# Patient Record
Sex: Female | Born: 1980 | Hispanic: No | Marital: Single | Smoking: Never smoker
Health system: Southern US, Community
[De-identification: ages and names within clinical notes are randomized; demographics above are authoritative.]

## PROBLEM LIST (undated history)

## (undated) DIAGNOSIS — G35 Multiple sclerosis: Secondary | ICD-10-CM

## (undated) DIAGNOSIS — F32A Depression, unspecified: Secondary | ICD-10-CM

## (undated) DIAGNOSIS — F419 Anxiety disorder, unspecified: Secondary | ICD-10-CM

## (undated) DIAGNOSIS — I10 Essential (primary) hypertension: Secondary | ICD-10-CM

## (undated) DIAGNOSIS — F329 Major depressive disorder, single episode, unspecified: Secondary | ICD-10-CM

## (undated) DIAGNOSIS — D649 Anemia, unspecified: Secondary | ICD-10-CM

## (undated) DIAGNOSIS — E669 Obesity, unspecified: Secondary | ICD-10-CM

## (undated) HISTORY — PX: VAGINAL HYSTERECTOMY: SUR661

---

## 2018-08-26 ENCOUNTER — Encounter (HOSPITAL_COMMUNITY): Payer: Self-pay | Admitting: Emergency Medical Services

## 2018-08-26 ENCOUNTER — Other Ambulatory Visit: Payer: Self-pay

## 2018-08-26 ENCOUNTER — Inpatient Hospital Stay (HOSPITAL_COMMUNITY)
Admission: EM | Admit: 2018-08-26 | Discharge: 2018-08-31 | DRG: 060 | Disposition: A | Discharge status: Home / Self Care | Payer: Medicaid Other | Attending: Internal Medicine | Admitting: Internal Medicine

## 2018-08-26 DIAGNOSIS — E669 Obesity, unspecified: Secondary | ICD-10-CM | POA: Diagnosis present

## 2018-08-26 DIAGNOSIS — I10 Essential (primary) hypertension: Secondary | ICD-10-CM | POA: Diagnosis present

## 2018-08-26 DIAGNOSIS — G35 Multiple sclerosis: Secondary | ICD-10-CM | POA: Diagnosis not present

## 2018-08-26 DIAGNOSIS — H547 Unspecified visual loss: Secondary | ICD-10-CM | POA: Diagnosis present

## 2018-08-26 DIAGNOSIS — R200 Anesthesia of skin: Secondary | ICD-10-CM | POA: Diagnosis present

## 2018-08-26 DIAGNOSIS — Z6839 Body mass index (BMI) 39.0-39.9, adult: Secondary | ICD-10-CM

## 2018-08-26 HISTORY — DX: Obesity, unspecified: E66.9

## 2018-08-26 HISTORY — DX: Multiple sclerosis: G35

## 2018-08-26 HISTORY — DX: Essential (primary) hypertension: I10

## 2018-08-26 HISTORY — DX: Depression, unspecified: F32.A

## 2018-08-26 HISTORY — DX: Anemia, unspecified: D64.9

## 2018-08-26 HISTORY — DX: Major depressive disorder, single episode, unspecified: F32.9

## 2018-08-26 HISTORY — DX: Anxiety disorder, unspecified: F41.9

## 2018-08-26 LAB — CBC
HCT: 41.4 % (ref 36.0–46.0)
HEMOGLOBIN: 13.4 g/dL (ref 12.0–15.0)
MCH: 31.6 pg (ref 26.0–34.0)
MCHC: 32.4 g/dL (ref 30.0–36.0)
MCV: 97.6 fL (ref 78.0–100.0)
Platelets: 270 10*3/uL (ref 150–400)
RBC: 4.24 MIL/uL (ref 3.87–5.11)
RDW: 12.7 % (ref 11.5–15.5)
WBC: 8.1 10*3/uL (ref 4.0–10.5)

## 2018-08-26 LAB — BASIC METABOLIC PANEL
Anion gap: 10 (ref 5–15)
BUN: 9 mg/dL (ref 6–20)
CHLORIDE: 108 mmol/L (ref 98–111)
CO2: 24 mmol/L (ref 22–32)
CREATININE: 0.99 mg/dL (ref 0.44–1.00)
Calcium: 9 mg/dL (ref 8.9–10.3)
GFR calc non Af Amer: >60 mL/min (ref >60)
Glucose, Bld: 104 mg/dL — ABNORMAL HIGH (ref 70–99)
Potassium: 3.6 mmol/L (ref 3.5–5.1)
SODIUM: 142 mmol/L (ref 135–145)

## 2018-08-26 NOTE — ED Triage Notes (Signed)
Pt c/o blurred vision in right eye, numbness and tingling in right arm and right leg. Hx MS, not currently taking maintenance medications.

## 2018-08-26 NOTE — ED Provider Notes (Signed)
Patient placed in Quick Look pathway, seen and evaluated   Chief Complaint: Blurred vision and right arm numbness  HPI:   Patient with a history of MS, not on immunomodulators presents with 3 days of worsening left arm numbness as well as blurry vision in left eye.  Also reports left arm feels somewhat weak.  She reports symptoms are similar to prior flareups.  She is in between neurologists given recent change in insurance.   ROS: No headache   Physical Exam:   Gen: No distress  Neuro: Awake and Alert  Skin: Warm    Focused Exam: Strength 5/5 in bilateral upper extremities.  Sensation to light touch intact in radian, median and ulnar distribution of right hand, although she reports sensation is reduced compared to left hand.  Pupils equal round and reactive to light.  No scleral erythema.  EOMs intact without pain.   Initiation of care has begun. The patient has been counseled on the process, plan, and necessity for staying for the completion/evaluation, and the remainder of the medical screening examination No immuomodulators   Kellie Shropshire, PA-C 08/26/18 2017    Melene Plan, DO 08/26/18 2220

## 2018-08-27 ENCOUNTER — Emergency Department (HOSPITAL_COMMUNITY): Payer: Medicaid Other

## 2018-08-27 ENCOUNTER — Encounter (HOSPITAL_COMMUNITY): Payer: Self-pay | Admitting: Internal Medicine

## 2018-08-27 ENCOUNTER — Other Ambulatory Visit: Payer: Self-pay

## 2018-08-27 DIAGNOSIS — I10 Essential (primary) hypertension: Secondary | ICD-10-CM | POA: Diagnosis present

## 2018-08-27 DIAGNOSIS — H547 Unspecified visual loss: Secondary | ICD-10-CM | POA: Diagnosis not present

## 2018-08-27 DIAGNOSIS — G35 Multiple sclerosis: Secondary | ICD-10-CM | POA: Diagnosis present

## 2018-08-27 DIAGNOSIS — H538 Other visual disturbances: Secondary | ICD-10-CM | POA: Diagnosis not present

## 2018-08-27 DIAGNOSIS — R200 Anesthesia of skin: Secondary | ICD-10-CM | POA: Diagnosis not present

## 2018-08-27 DIAGNOSIS — Z6839 Body mass index (BMI) 39.0-39.9, adult: Secondary | ICD-10-CM | POA: Diagnosis not present

## 2018-08-27 DIAGNOSIS — E669 Obesity, unspecified: Secondary | ICD-10-CM | POA: Diagnosis not present

## 2018-08-27 LAB — URINALYSIS, ROUTINE W REFLEX MICROSCOPIC
Bilirubin Urine: NEGATIVE
Glucose, UA: NEGATIVE mg/dL
Hgb urine dipstick: NEGATIVE
Ketones, ur: NEGATIVE mg/dL
Leukocytes, UA: NEGATIVE
NITRITE: NEGATIVE
Protein, ur: NEGATIVE mg/dL
SPECIFIC GRAVITY, URINE: 1.026 (ref 1.005–1.030)
pH: 6 (ref 5.0–8.0)

## 2018-08-27 LAB — PREGNANCY, URINE: PREG TEST UR: NEGATIVE

## 2018-08-27 MED ORDER — ACETAMINOPHEN 325 MG PO TABS: 650.0000 mg | ORAL_TABLET | Freq: Four times a day (QID) | ORAL | Status: DC | PRN

## 2018-08-27 MED ORDER — LORAZEPAM 2 MG/ML IJ SOLN
1.0000 mg | Freq: Once | INTRAMUSCULAR | Status: AC
  Administered 2018-08-27: 1 mg via INTRAVENOUS
  Filled 2018-08-27: qty 1

## 2018-08-27 MED ORDER — SODIUM CHLORIDE 0.9 % IV SOLN: 1000.0000 mg | INTRAVENOUS | Status: DC

## 2018-08-27 MED ORDER — SODIUM CHLORIDE 0.9 % IV SOLN
1000.0000 mg | INTRAVENOUS | Status: AC
  Administered 2018-08-28 – 2018-08-31 (×4): 1000 mg via INTRAVENOUS
  Filled 2018-08-27 (×4): qty 8

## 2018-08-27 MED ORDER — LACTATED RINGERS IV SOLN
INTRAVENOUS | Status: DC
  Administered 2018-08-27 – 2018-08-30 (×7): via INTRAVENOUS

## 2018-08-27 MED ORDER — ENOXAPARIN SODIUM 40 MG/0.4ML ~~LOC~~ SOLN
40.0000 mg | SUBCUTANEOUS | Status: DC
  Administered 2018-08-27 – 2018-08-30 (×4): 40 mg via SUBCUTANEOUS
  Filled 2018-08-27 (×4): qty 0.4

## 2018-08-27 MED ORDER — SODIUM CHLORIDE 0.9 % IV SOLN
1000.0000 mg | Freq: Once | INTRAVENOUS | Status: AC
  Administered 2018-08-27: 1000 mg via INTRAVENOUS
  Filled 2018-08-27: qty 8

## 2018-08-27 MED ORDER — DOCUSATE SODIUM 100 MG PO CAPS
100.0000 mg | ORAL_CAPSULE | Freq: Two times a day (BID) | ORAL | Status: DC
  Administered 2018-08-27 – 2018-08-28 (×2): 100 mg via ORAL
  Filled 2018-08-27 (×3): qty 1

## 2018-08-27 MED ORDER — ACETAMINOPHEN 650 MG RE SUPP: 650.0000 mg | Freq: Four times a day (QID) | RECTAL | Status: DC | PRN

## 2018-08-27 MED ORDER — ONDANSETRON HCL 4 MG PO TABS: 4.0000 mg | ORAL_TABLET | Freq: Four times a day (QID) | ORAL | Status: DC | PRN

## 2018-08-27 MED ORDER — GADOBUTROL 1 MMOL/ML IV SOLN
10.0000 mL | Freq: Once | INTRAVENOUS | Status: AC | PRN
  Administered 2018-08-27: 10 mL via INTRAVENOUS

## 2018-08-27 MED ORDER — ONDANSETRON HCL 4 MG/2ML IJ SOLN: 4.0000 mg | Freq: Four times a day (QID) | INTRAMUSCULAR | Status: DC | PRN

## 2018-08-27 MED ORDER — LORAZEPAM 1 MG PO TABS: 1.0000 mg | ORAL_TABLET | Freq: Once | ORAL | Status: DC

## 2018-08-27 NOTE — ED Notes (Addendum)
Pt using phone, await neuro consult

## 2018-08-27 NOTE — Consult Note (Signed)
Neurology Consultation  Reason for Consult: Possible MS exacerbation Referring Physician: Dr. Manus Gunning  CC: Blurred vision, right-sided numbness  History is obtained from: Patient, chart  HPI: Jennifer Arias is a 37 y.o. female with a past medical history of multiple sclerosis diagnosed in 2014 for which she was on treatment with Aubagio for couple of years when she discontinued taking the medications on her own volition, who was in her usual state of health about 3 weeks ago when she started noticing some tingling in her right hand. She was out on a cruise and when she returned from a cruise 3 weeks ago, she started noticing some tingling in the right hand that then spread out to involve her whole right hemibody.  A few days ago she also started noticing some blurred vision, more so when she is looking with both eyes and reports improvement in her blood vision while looking from only one eye when the other eye is covered. In 2014, per chart review, she had some left-sided numbness for which she was evaluated with brain MRI and imaging revealed a right brachium pontis and multiple cervical spine lesions consistent with demyelination with the C-spine lesion showing active demyelination in the form of contrast-enhancement.  She had received IV steroids acutely and then started on Aubagio by a neurologist in [facility].  I have no records to review from [place]. She reports that currently she has no neurologist that she has not had symptoms for many years and she had an insurance change and could not continue to see-year-old neurologist, but because of no new symptoms, she did not seek a new neurology practice/neurologist. She reports no fevers, chills, headaches, chest pain, shortness of breath, nausea, vomiting, abdominal pain. She does report urinary urgency and hesitancy. She also reports occasional constipation, which was worse when she was on Aubagio. She also complains of  off-and-on pain in her legs, for which at some point she was prescribed Lyrica for.  She has not been taking that as well as she has not had too much of pain and discomfort. Prior to this presentation, she has not had any optic neuritis or any eye symptoms.  ROS:ROS was performed and is negative except as noted in the HPI.    Past Medical History:  Diagnosis Date  . MS (multiple sclerosis) (HCC)    No family history on file. Father has MS  Social History:   has no tobacco, alcohol, and drug history on file. Patient does not smoke. She does consume alcohol and at times binge drinks She reports using marijuana occasionally.  Medications  Current Facility-Administered Medications:  .  methylPREDNISolone sodium succinate (SOLU-MEDROL) 1,000 mg in sodium chloride 0.9 % 50 mL IVPB, 1,000 mg, Intravenous, Once, Rancour, Stephen, MD No current outpatient medications on file.  Exam: Current vital signs: BP (!) 164/113   Pulse 66   Temp 98.4 F (36.9 C)   Resp 18   SpO2 98%  Vital signs in last 24 hours: Temp:  [98.4 F (36.9 C)] 98.4 F (36.9 C) (09/24 2008) Pulse Rate:  [66-76] 66 (09/25 0030) Resp:  [17-18] 18 (09/25 0030) BP: (164-169)/(109-113) 164/113 (09/25 0030) SpO2:  [98 %-99 %] 98 % (09/25 0030) GENERAL: Awake, alert in NAD HEENT: - Normocephalic and atraumatic, dry mm, no LN++, no Thyromegally LUNGS - Clear to auscultation bilaterally with no wheezes CV - S1S2 RRR, no m/r/g, equal pulses bilaterally. ABDOMEN - Soft, nontender, nondistended with normoactive BS Ext: warm, well perfused, intact  peripheral pulses, no edema  NEURO:  Mental Status: AA&Ox3  Language: speech is clear with no dysarthria.  Naming, repetition, fluency, and comprehension intact. Cranial Nerves: PERRL, no APD. EOMI, visual fields full, visual acuity 20/25 but reports seeing things blurred, no facial asymmetry, facial sensation intact, hearing intact, tongue/uvula/soft palate midline, normal  sternocleidomastoid and trapezius muscle strength.  Tongue midline. Motor: 5/5 bilaterally with no vertical drift Tone: is normal and bulk is normal Sensation-decreased on the right hemibody to light touch and vibration Coordination: FTN intact bilaterally, no ataxia in BLE. Gait- deferred Deep tendon reflexes: 3+ biceps bilaterally, 3+ brachial radialis bilaterally, 3+ knee jerks bilaterally, 2+ ankle jerks bilaterally, upgoing toes, positive Hoffman's bilaterally.  Labs I have reviewed labs in epic and the results pertinent to this consultation are: CBC    Component Value Date/Time   WBC 8.1 08/26/2018 2017   RBC 4.24 08/26/2018 2017   HGB 13.4 08/26/2018 2017   HCT 41.4 08/26/2018 2017   PLT 270 08/26/2018 2017   MCV 97.6 08/26/2018 2017   MCH 31.6 08/26/2018 2017   MCHC 32.4 08/26/2018 2017   RDW 12.7 08/26/2018 2017   CMP     Component Value Date/Time   NA 142 08/26/2018 2017   K 3.6 08/26/2018 2017   CL 108 08/26/2018 2017   CO2 24 08/26/2018 2017   GLUCOSE 104 (H) 08/26/2018 2017   BUN 9 08/26/2018 2017   CREATININE 0.99 08/26/2018 2017   CALCIUM 9.0 08/26/2018 2017   GFRNONAA >60 08/26/2018 2017   GFRAA >60 08/26/2018 2017  Urinalysis unremarkable for UTI  Imaging I have reviewed the images obtained: MRI examination of the brain, orbits and cervical spine with and without contrast Subcentimeter focus of diffusion abnormality with associated contrast enhancement seen in the left paramedian pontomedullary junction, likely consistent with active demyelination. Normal MRI of the orbits. Cervical spine MRI shows C2-3 and C3-4 level T2 hyperintensities without any enhancement. She has old MRI in PACS, not accessible through the EMR but only directly through PACS that showed an MRI brain and C-spine from 2014 with no brain lesions but enhancing lesion in the upper cervical cord.  The current T2 hyperintensity is seen on the C-spine MRI is likely sequelae of that  lesion.  Assessment:  Jennifer Arias is a 37 year old African-American woman with a past medical history of multiple sclerosis that was diagnosed in 2014, for which she was on treatment with Aubagio for a few years when she stopped taking the medications on her own volition, who now presents with right-sided tingling and numbness and blurred vision along with an MRI of the brain that shows active enhancement in a small lesion seen in the left pontomedullary junction as well as sequela of demyelinating disease in her cervical spinal cord. I have reiterated the importance of getting back on a disease modifying agent for the longer term to prevent accruing disability with relapses. For now, we will treat with high-dose steroids for the acute exacerbation.  Urinalysis and blood work reassuring for no evidence of infection.  Impression: Relapsing remitting multiple sclerosis- acute exacerbation  Recommendations: --Start IV Solu-Medrol 1 g daily for 5 days-first dose now in the emergency room.  --Patient would like to explore the possibility of possibly getting some infusions at home and not necessarily stay in the hospital 5 days if possible.  I would defer this to the primary team and discharge planning if possible.  --She will need follow-up with outpatient neurology-Dr. Despina Arias at [facility] in 4 to 6 weeks after discharge.  Neurology will continue to follow with you.  Please call with questions.  -- Milon Dikes, MD [facility] Pager: [phone] If 7pm to 7am, please call on call as listed on AMION.

## 2018-08-27 NOTE — ED Notes (Signed)
ED Provider at bedside. 

## 2018-08-27 NOTE — H&P (Signed)
History and Physical    [name]:[id number] DOB: 02/28/1981 DOA: 08/26/2018  PCP: Patient, No Pcp Per - last saw a doctor in March [facility] [place] in [place]) Consultants:  Renata Caprice - neurology Patient coming from:  Here temporarily in this area - staying with a friend; NOK: Mother, [phone], Louretta Shorten  Chief Complaint:  Diplopia  HPI: Jennifer Arias is a 37 y.o. female with medical history significant of MS presenting with possible flare.  She has had flares in the past with tingling/numbness in limbs.  This started again intermittently a few days ago.  She went out drinking a few days ago and started swerving while driving home, felt very impaired despite minimal ETOH.  The next day she slept all day, felt like she had a hangover despite not drinking much.  It happened again yesterday - she again slept all day.  When she tried to drive, she still felt drunk.  She realized she had diplopia.  Driving, she "felt like the lines were moving."  She was driving with one eye closed and so she decided to stop at an urgent care.  They sent her to the ER.  She does have an occasional sharp shooting pain through her eyeball; her neurologist previously didn't think that was related to her MS.  She previously took medication in the past, but it became so overwhelming that she stopped taking it (needs to resume Aubagio and Lyrica).  Overall, her MS has been pretty well controlled.   ED Course: Carryover, per Dr. Corey Harold:  37 year old female with previous history of multiple sclerosis in remission presents with 3 days history of diplopia. MRI of the brain orbits and C-spine was done. Shows acute demyelinating lesion. There is also possibility of stroke. Neurology on case. Was started on Solu-Medrol 1 dose.   Review of Systems: As per HPI; otherwise review of systems reviewed and negative.   Ambulatory Status:  Ambulates without assistance  Past Medical History:  Diagnosis Date  .  Essential hypertension   . MS (multiple sclerosis) (HCC)   . Obesity     Past Surgical History:  Procedure Laterality Date  . PARTIAL HYSTERECTOMY      Social History   Socioeconomic History  . Marital status: Single    Spouse name: Not on file  . Number of children: Not on file  . Years of education: Not on file  . Highest education level: Not on file  Occupational History  . Occupation: various part-time jobs  Engineer, production  . Financial resource strain: Not on file  . Food insecurity:    Worry: Not on file    Inability: Not on file  . Transportation needs:    Medical: Not on file    Non-medical: Not on file  Tobacco Use  . Smoking status: Never Smoker  . Smokeless tobacco: Never Used  Substance and Sexual Activity  . Alcohol use: Yes    Comment: occasional  . Drug use: Yes    Types: Marijuana    Comment: daily use  . Sexual activity: Not on file  Lifestyle  . Physical activity:    Days per week: Not on file    Minutes per session: Not on file  . Stress: Not on file  Relationships  . Social connections:    Talks on phone: Not on file    Gets together: Not on file    Attends religious service: Not on file    Active member of club or organization: Not  on file    Attends meetings of clubs or organizations: Not on file    Relationship status: Not on file  . Intimate partner violence:    Fear of current or ex partner: Not on file    Emotionally abused: Not on file    Physically abused: Not on file    Forced sexual activity: Not on file  Other Topics Concern  . Not on file  Social History Narrative  . Not on file    Allergies  Allergen Reactions  . Sulfa Antibiotics Hives, Itching and Rash    Family History  Problem Relation Age of Onset  . Multiple sclerosis Father     Prior to Admission medications   Not on File    Physical Exam: Vitals:   08/26/18 2008 08/27/18 0030  BP: (!) 169/109 (!) 164/113  Pulse: 76 66  Resp: 17 18  Temp: 98.4 F  (36.9 C)   SpO2: 99% 98%     General:  Appears calm and comfortable and is NAD Eyes:  PERRL, EOMI, normal lids, iris ENT:  grossly normal hearing, lips & tongue, mmm; appropriate dentition Neck:  no LAD, masses or thyromegaly; no carotid bruits Cardiovascular:  RRR, no m/r/g. No LE edema.  Respiratory:   CTA bilaterally with no wheezes/rales/rhonchi.  Normal respiratory effort. Abdomen:  soft, NT, ND, NABS Back:   normal alignment, no CVAT Skin:  no rash or induration seen on limited exam Musculoskeletal:  grossly normal tone BUE/BLE, good ROM, no bony abnormality Psychiatric:  grossly normal mood and affect, speech fluent and appropriate, AOx3 Neurologic:  CN 2-12 grossly intact, moves all extremities in coordinated fashion, sensation intact    Radiological Exams on Admission: Mr [name] And Wo Contrast  Result Date: 08/27/2018 CLINICAL DATA:  Initial evaluation for worsening left arm numbness with blurry vision in left eye. History of multiple sclerosis. EXAM: MRI HEAD AND ORBITS WITHOUT AND WITH CONTRAST MRI CERVICAL SPINE WITHOUT AND WITH CONTRAST TECHNIQUE: Multiplanar, multiecho pulse sequences of the brain and surrounding structures were obtained without and with intravenous contrast. Multiplanar, multiecho pulse sequences of the orbits and surrounding structures were obtained including fat saturation techniques, before and after intravenous contrast administration. CONTRAST:  10 CC OF GADAVIST. COMPARISON:  None available. FINDINGS: MRI HEAD FINDINGS Brain: Single 6 mm focus of diffusion abnormality seen at the left paramedian aspect of the pontomedullary junction (series 5, image 43). Associated somewhat vague T2/FLAIR signal abnormality (series 11, image 8). Lesion demonstrates postcontrast enhancement (series 46, image 12). Finding is nonspecific, and could reflect a small focus of acute demyelination or possibly subacute infarct. Appearance of the brain is otherwise normal.  Cerebral volume within normal limits. Few additional punctate T2/FLAIR hyperintensities noted within the supratentorial cerebral white matter, nonspecific. No other evidence for acute or subacute infarct. No encephalomalacia to suggest chronic cortical infarction. No foci of susceptibility artifact to suggest acute or chronic intracranial hemorrhage. No mass lesion, midline shift or mass effect. No hydrocephalus. No extra-axial fluid collection. Incidental note made of an empty sella. No other abnormal enhancement. Vascular: Major intracranial vascular flow voids maintained. Skull and upper cervical spine: Craniocervical junction normal. No focal marrow replacing lesion. Scalp soft tissues unremarkable. Other: No mastoid effusion.  Inner ear structures normal. MRI ORBITS FINDINGS Orbits: Globes are symmetric in size with normal appearance and morphology bilaterally. Optic nerves symmetric. No optic nerve edema or enhancement to suggest acute optic neuritis. Extra-ocular muscles within normal limits bilaterally. Intraconal and extraconal fat well-maintained.  Lacrimal glands normal. Superior orbital veins symmetric and normal. No abnormality at the orbital apices. Visualized sinuses: Visualized paranasal sinuses are clear. Soft tissues: Visualized periorbital soft tissues unremarkable. MRI CERVICAL SPINE FINDINGS Alignment: Mild levoscoliosis with straightening of the normal cervical lordosis. No listhesis. Vertebra: Vertebral body heights maintained without evidence for acute or chronic fracture. Bone marrow signal intensity within normal limits. No worrisome osseous lesions. No abnormal marrow edema. Cord: Subtle patchy T2 signal abnormality within the right aspect of the cord at the level of C2-3 (series 32, image 10). Additional subtle possible cord signal abnormality within the right hemi cord at C3-4 (series 32, image 17). No other cord signal abnormality. No abnormal cord expansion or enhancement to suggest  active demyelination. Paraspinous soft tissues: Craniocervical junction normal. Paraspinous and prevertebral soft tissues within normal limits. Normal intravascular flow voids seen within the vertebral arteries bilaterally. Disc levels: C2-C3: Unremarkable. C3-C4: Right eccentric disc osteophyte complex with right greater than left uncovertebral hypertrophy. Flattening and indentation of the right ventral thecal sac with mild flattening of the right hemi cord. Mild spinal stenosis. Mild to moderate right C4 foraminal stenosis. C4-C5: Mild diffuse disc bulge with bilateral uncovertebral hypertrophy. No spinal stenosis. Mild right C5 foraminal narrowing. C5-C6: Diffuse disc bulge with right greater than left uncovertebral hypertrophy. Flattening and partial effacement of the ventral thecal sac with resultant moderate spinal stenosis. Mild flattening of the ventral cord. Severe right with moderate left C6 foraminal stenosis. C6-C7: Diffuse disc bulge, slightly asymmetric to the left. Bilateral uncovertebral hypertrophy. Flattening of the ventral CSF and resultant mild spinal stenosis without cord deformity. Foramina remain patent. C7-T1:  Unremarkable. Visualized upper thoracic spine demonstrates no significant finding. IMPRESSION: MRI HEAD IMPRESSION: 1. 6 mm focus of diffusion abnormality with associated enhancement involving the left paramedian pontomedullary junction. Finding is nonspecific, and could reflect a small focus of acute demyelination. A focal subacute ischemic infarct would be the primary differential consideration. Either way, finding could produce patient's symptoms. 2. No other acute intracranial abnormality. 3. Empty sella. MRI ORBITS IMPRESSION: Normal MRI of the orbits. No evidence for acute optic neuritis or other abnormality. MRI CERVICAL IMPRESSION: 1. Question 2 small foci of cord signal abnormality within the right hemi cord at the level of C2-3 and C3-4 as above, suggestive for possible  underlying demyelinating disease. No evidence for active demyelination. 2. Degenerative disc osteophyte at C5-6 with resultant moderate canal with severe right and moderate left C6 foraminal narrowing. 3. Right eccentric disc osteophyte at C3-4 with resultant mild canal with mild to moderate right C4 foraminal stenosis. 4. Mild right C5 foraminal stenosis related uncovertebral hypertrophy. Electronically Signed   By: Rise Mu M.D.   On: 08/27/2018 04:44   Mr Cervical Spine W Or Wo Contrast  Result Date: 08/27/2018 CLINICAL DATA:  Initial evaluation for worsening left arm numbness with blurry vision in left eye. History of multiple sclerosis. EXAM: MRI HEAD AND ORBITS WITHOUT AND WITH CONTRAST MRI CERVICAL SPINE WITHOUT AND WITH CONTRAST TECHNIQUE: Multiplanar, multiecho pulse sequences of the brain and surrounding structures were obtained without and with intravenous contrast. Multiplanar, multiecho pulse sequences of the orbits and surrounding structures were obtained including fat saturation techniques, before and after intravenous contrast administration. CONTRAST:  10 CC OF GADAVIST. COMPARISON:  None available. FINDINGS: MRI HEAD FINDINGS Brain: Single 6 mm focus of diffusion abnormality seen at the left paramedian aspect of the pontomedullary junction (series 5, image 43). Associated somewhat vague T2/FLAIR signal abnormality (series 11, image 8).  Lesion demonstrates postcontrast enhancement (series 46, image 12). Finding is nonspecific, and could reflect a small focus of acute demyelination or possibly subacute infarct. Appearance of the brain is otherwise normal. Cerebral volume within normal limits. Few additional punctate T2/FLAIR hyperintensities noted within the supratentorial cerebral white matter, nonspecific. No other evidence for acute or subacute infarct. No encephalomalacia to suggest chronic cortical infarction. No foci of susceptibility artifact to suggest acute or chronic  intracranial hemorrhage. No mass lesion, midline shift or mass effect. No hydrocephalus. No extra-axial fluid collection. Incidental note made of an empty sella. No other abnormal enhancement. Vascular: Major intracranial vascular flow voids maintained. Skull and upper cervical spine: Craniocervical junction normal. No focal marrow replacing lesion. Scalp soft tissues unremarkable. Other: No mastoid effusion.  Inner ear structures normal. MRI ORBITS FINDINGS Orbits: Globes are symmetric in size with normal appearance and morphology bilaterally. Optic nerves symmetric. No optic nerve edema or enhancement to suggest acute optic neuritis. Extra-ocular muscles within normal limits bilaterally. Intraconal and extraconal fat well-maintained. Lacrimal glands normal. Superior orbital veins symmetric and normal. No abnormality at the orbital apices. Visualized sinuses: Visualized paranasal sinuses are clear. Soft tissues: Visualized periorbital soft tissues unremarkable. MRI CERVICAL SPINE FINDINGS Alignment: Mild levoscoliosis with straightening of the normal cervical lordosis. No listhesis. Vertebra: Vertebral body heights maintained without evidence for acute or chronic fracture. Bone marrow signal intensity within normal limits. No worrisome osseous lesions. No abnormal marrow edema. Cord: Subtle patchy T2 signal abnormality within the right aspect of the cord at the level of C2-3 (series 32, image 10). Additional subtle possible cord signal abnormality within the right hemi cord at C3-4 (series 32, image 17). No other cord signal abnormality. No abnormal cord expansion or enhancement to suggest active demyelination. Paraspinous soft tissues: Craniocervical junction normal. Paraspinous and prevertebral soft tissues within normal limits. Normal intravascular flow voids seen within the vertebral arteries bilaterally. Disc levels: C2-C3: Unremarkable. C3-C4: Right eccentric disc osteophyte complex with right greater than  left uncovertebral hypertrophy. Flattening and indentation of the right ventral thecal sac with mild flattening of the right hemi cord. Mild spinal stenosis. Mild to moderate right C4 foraminal stenosis. C4-C5: Mild diffuse disc bulge with bilateral uncovertebral hypertrophy. No spinal stenosis. Mild right C5 foraminal narrowing. C5-C6: Diffuse disc bulge with right greater than left uncovertebral hypertrophy. Flattening and partial effacement of the ventral thecal sac with resultant moderate spinal stenosis. Mild flattening of the ventral cord. Severe right with moderate left C6 foraminal stenosis. C6-C7: Diffuse disc bulge, slightly asymmetric to the left. Bilateral uncovertebral hypertrophy. Flattening of the ventral CSF and resultant mild spinal stenosis without cord deformity. Foramina remain patent. C7-T1:  Unremarkable. Visualized upper thoracic spine demonstrates no significant finding. IMPRESSION: MRI HEAD IMPRESSION: 1. 6 mm focus of diffusion abnormality with associated enhancement involving the left paramedian pontomedullary junction. Finding is nonspecific, and could reflect a small focus of acute demyelination. A focal subacute ischemic infarct would be the primary differential consideration. Either way, finding could produce patient's symptoms. 2. No other acute intracranial abnormality. 3. Empty sella. MRI ORBITS IMPRESSION: Normal MRI of the orbits. No evidence for acute optic neuritis or other abnormality. MRI CERVICAL IMPRESSION: 1. Question 2 small foci of cord signal abnormality within the right hemi cord at the level of C2-3 and C3-4 as above, suggestive for possible underlying demyelinating disease. No evidence for active demyelination. 2. Degenerative disc osteophyte at C5-6 with resultant moderate canal with severe right and moderate left C6 foraminal narrowing. 3. Right eccentric  disc osteophyte at C3-4 with resultant mild canal with mild to moderate right C4 foraminal stenosis. 4. Mild  right C5 foraminal stenosis related uncovertebral hypertrophy. Electronically Signed   By: Rise Mu M.D.   On: 08/27/2018 04:44   Mr Rockwell [place] ZO Contrast  Result Date: 08/27/2018 CLINICAL DATA:  Initial evaluation for worsening left arm numbness with blurry vision in left eye. History of multiple sclerosis. EXAM: MRI HEAD AND ORBITS WITHOUT AND WITH CONTRAST MRI CERVICAL SPINE WITHOUT AND WITH CONTRAST TECHNIQUE: Multiplanar, multiecho pulse sequences of the brain and surrounding structures were obtained without and with intravenous contrast. Multiplanar, multiecho pulse sequences of the orbits and surrounding structures were obtained including fat saturation techniques, before and after intravenous contrast administration. CONTRAST:  10 CC OF GADAVIST. COMPARISON:  None available. FINDINGS: MRI HEAD FINDINGS Brain: Single 6 mm focus of diffusion abnormality seen at the left paramedian aspect of the pontomedullary junction (series 5, image 43). Associated somewhat vague T2/FLAIR signal abnormality (series 11, image 8). Lesion demonstrates postcontrast enhancement (series 46, image 12). Finding is nonspecific, and could reflect a small focus of acute demyelination or possibly subacute infarct. Appearance of the brain is otherwise normal. Cerebral volume within normal limits. Few additional punctate T2/FLAIR hyperintensities noted within the supratentorial cerebral white matter, nonspecific. No other evidence for acute or subacute infarct. No encephalomalacia to suggest chronic cortical infarction. No foci of susceptibility artifact to suggest acute or chronic intracranial hemorrhage. No mass lesion, midline shift or mass effect. No hydrocephalus. No extra-axial fluid collection. Incidental note made of an empty sella. No other abnormal enhancement. Vascular: Major intracranial vascular flow voids maintained. Skull and upper cervical spine: Craniocervical junction normal. No focal marrow replacing  lesion. Scalp soft tissues unremarkable. Other: No mastoid effusion.  Inner ear structures normal. MRI ORBITS FINDINGS Orbits: Globes are symmetric in size with normal appearance and morphology bilaterally. Optic nerves symmetric. No optic nerve edema or enhancement to suggest acute optic neuritis. Extra-ocular muscles within normal limits bilaterally. Intraconal and extraconal fat well-maintained. Lacrimal glands normal. Superior orbital veins symmetric and normal. No abnormality at the orbital apices. Visualized sinuses: Visualized paranasal sinuses are clear. Soft tissues: Visualized periorbital soft tissues unremarkable. MRI CERVICAL SPINE FINDINGS Alignment: Mild levoscoliosis with straightening of the normal cervical lordosis. No listhesis. Vertebra: Vertebral body heights maintained without evidence for acute or chronic fracture. Bone marrow signal intensity within normal limits. No worrisome osseous lesions. No abnormal marrow edema. Cord: Subtle patchy T2 signal abnormality within the right aspect of the cord at the level of C2-3 (series 32, image 10). Additional subtle possible cord signal abnormality within the right hemi cord at C3-4 (series 32, image 17). No other cord signal abnormality. No abnormal cord expansion or enhancement to suggest active demyelination. Paraspinous soft tissues: Craniocervical junction normal. Paraspinous and prevertebral soft tissues within normal limits. Normal intravascular flow voids seen within the vertebral arteries bilaterally. Disc levels: C2-C3: Unremarkable. C3-C4: Right eccentric disc osteophyte complex with right greater than left uncovertebral hypertrophy. Flattening and indentation of the right ventral thecal sac with mild flattening of the right hemi cord. Mild spinal stenosis. Mild to moderate right C4 foraminal stenosis. C4-C5: Mild diffuse disc bulge with bilateral uncovertebral hypertrophy. No spinal stenosis. Mild right C5 foraminal narrowing. C5-C6: Diffuse  disc bulge with right greater than left uncovertebral hypertrophy. Flattening and partial effacement of the ventral thecal sac with resultant moderate spinal stenosis. Mild flattening of the ventral cord. Severe right with moderate left C6 foraminal stenosis. C6-C7:  Diffuse disc bulge, slightly asymmetric to the left. Bilateral uncovertebral hypertrophy. Flattening of the ventral CSF and resultant mild spinal stenosis without cord deformity. Foramina remain patent. C7-T1:  Unremarkable. Visualized upper thoracic spine demonstrates no significant finding. IMPRESSION: MRI HEAD IMPRESSION: 1. 6 mm focus of diffusion abnormality with associated enhancement involving the left paramedian pontomedullary junction. Finding is nonspecific, and could reflect a small focus of acute demyelination. A focal subacute ischemic infarct would be the primary differential consideration. Either way, finding could produce patient's symptoms. 2. No other acute intracranial abnormality. 3. Empty sella. MRI ORBITS IMPRESSION: Normal MRI of the orbits. No evidence for acute optic neuritis or other abnormality. MRI CERVICAL IMPRESSION: 1. Question 2 small foci of cord signal abnormality within the right hemi cord at the level of C2-3 and C3-4 as above, suggestive for possible underlying demyelinating disease. No evidence for active demyelination. 2. Degenerative disc osteophyte at C5-6 with resultant moderate canal with severe right and moderate left C6 foraminal narrowing. 3. Right eccentric disc osteophyte at C3-4 with resultant mild canal with mild to moderate right C4 foraminal stenosis. 4. Mild right C5 foraminal stenosis related uncovertebral hypertrophy. Electronically Signed   By: Rise Mu M.D.   On: 08/27/2018 04:44    EKG: Not done   Labs on Admission: I have personally reviewed the available labs and imaging studies at the time of the admission.  Pertinent labs:   Unremarkable BMP Normal CBC Upreg  negative UA uremarkable   Assessment/Plan Principal Problem:   Multiple sclerosis exacerbation (HCC) Active Problems:   Essential hypertension   MS exacerbation -Patient with prior h/o MS, not on medications, presenting with diplopia >48 hours old with demyelination and an area of active enhancement of a small lesion seen on MRILT -  -Neurology was consulted and agree that this is likely an acute exacerbation of relapsing/remitting MS -Physical/occupational therapy consults.  -Continue IV Solu-Medrol 1000 mg daily x 5 days -She really needs to resume outpatient neurology f/u and resume disease-modifying medications  HTN -She reports prior diagnosis of HTN -With prior weight loss, her BP was appropriately controlled without medications -She appears to have recurrent HTN and appears likely to need medications for this issue, as well -Will give prn hydralazine IV for now and follow    DVT prophylaxis:  Lovenox  Code Status:  Full - confirmed with patient Family Communication: None present Disposition Plan:  Home once clinically improved Consults called: Neurology; PT/OT  Admission status: Admit - It is my clinical opinion that admission to INPATIENT is reasonable and necessary because of the expectation that this patient will require hospital care that crosses at least 2 midnights to treat this condition based on the medical complexity of the problems presented.  Given the aforementioned information, the predictability of an adverse outcome is felt to be significant.     Jonah Blue MD [facility]  If note is complete, please contact covering daytime or nighttime physician. [email or web address] Password [facility]  08/27/2018, 9:45 AM

## 2018-08-27 NOTE — ED Notes (Signed)
Pt transported to MRI 

## 2018-08-27 NOTE — ED Notes (Signed)
Pt bk from MRI 

## 2018-08-27 NOTE — Progress Notes (Signed)
Patient admitted to floor 3w-15, orders reviewed

## 2018-08-27 NOTE — ED Notes (Signed)
Pt ambulatory to restroom with no assistance.

## 2018-08-27 NOTE — ED Notes (Signed)
Pt. Stated that using both eyes to see, vision is doubled and blurry, but when covering one eye individually, can see normal (not blurry, not double vision).  Pt. Also stated having some drinks the other day and has a history of MS.   Pt was able to read to the same level on visual acuity.  Both eyes 20/25 Lt. Eye 20/25 Rt eye 20/25

## 2018-08-27 NOTE — ED Provider Notes (Signed)
[facility] EMERGENCY DEPARTMENT Provider Note   CSN: [id number] Arrival date & time: 08/26/18  1945     History   Chief Complaint Chief Complaint  Patient presents with  . Tingling    HPI Jennifer Arias is a 37 y.o. female.  Patient with history of multiple sclerosis presenting with a 2-day history of visual changes and right-sided numbness.  Last seen normal 2 days ago.  States symptoms started after she drank alcohol heavily over the weekend.  She initially thought that she was hung over.  She notices blurry vision and double vision when she uses both eyes but vision becomes normal when when eyes closed.  Has tingling and numbness to her left arm and left leg as well.  No focal weakness.  No difficulty speaking or difficulty swallowing.  Patient states history of MS but has not been on medications for several years and does not have a neurologist.  Denies any headache, fever, chest pain, shortness of breath.  No bowel or bladder incontinence.  No difficulty speaking or difficulty swallowing.  The history is provided by the patient.    Past Medical History:  Diagnosis Date  . MS (multiple sclerosis) (HCC)     There are no active problems to display for this patient.   History reviewed. No pertinent surgical history.   OB History   None      Home Medications    Prior to Admission medications   Not on File    Family History No family history on file.  Social History Social History   Tobacco Use  . Smoking status: Not on file  Substance Use Topics  . Alcohol use: Not on file  . Drug use: Not on file     Allergies   Sulfa antibiotics   Review of Systems Review of Systems  Constitutional: Negative for activity change, appetite change and fever.  HENT: Negative for congestion and rhinorrhea.   Eyes: Positive for visual disturbance. Negative for photophobia.  Respiratory: Negative for chest tightness and shortness of breath.     Cardiovascular: Negative for chest pain.  Gastrointestinal: Negative for abdominal pain, nausea and vomiting.  Endocrine: Negative for polyuria.  Genitourinary: Negative for dysuria, hematuria, vaginal bleeding and vaginal discharge.  Musculoskeletal: Negative for arthralgias and myalgias.  Neurological: Positive for numbness. Negative for dizziness, weakness and headaches.   all other systems are negative except as noted in the HPI and PMH.     Physical Exam Updated Vital Signs BP (!) 164/113   Pulse 66   Temp 98.4 F (36.9 C)   Resp 18   SpO2 98%   Physical Exam  Constitutional: She is oriented to person, place, and time. She appears well-developed and well-nourished. No distress.  HENT:  Head: Normocephalic and atraumatic.  Mouth/Throat: Oropharynx is clear and moist. No oropharyngeal exudate.  Eyes: Pupils are equal, round, and reactive to light. Conjunctivae and EOM are normal.  Double vision resolves when one eye is closed.  Neck: Normal range of motion. Neck supple.  No meningismus.  Cardiovascular: Normal rate, regular rhythm, normal heart sounds and intact distal pulses.  No murmur heard. Pulmonary/Chest: Effort normal and breath sounds normal. No respiratory distress.  Abdominal: Soft. There is no tenderness. There is no rebound and no guarding.  Musculoskeletal: Normal range of motion. She exhibits no edema or tenderness.  Neurological: She is alert and oriented to person, place, and time. No cranial nerve deficit. She exhibits normal muscle tone. Coordination  normal.   5/5 strength throughout. CN 2-12 intact.Equal grip strength.   Decreased sensation subjectively to right arm and right leg  Skin: Skin is warm.  Psychiatric: She has a normal mood and affect. Her behavior is normal.  Nursing note and vitals reviewed.    ED Treatments / Results  Labs (all labs ordered are listed, but only abnormal results are displayed) Labs Reviewed  BASIC METABOLIC PANEL -  Abnormal; Notable for the following components:      Result Value   Glucose, Bld 104 (*)    All other components within normal limits  URINALYSIS, ROUTINE W REFLEX MICROSCOPIC - Abnormal; Notable for the following components:   APPearance HAZY (*)    All other components within normal limits  CBC  PREGNANCY, URINE    EKG None  Radiology Mr [name] And Wo Contrast  Result Date: 08/27/2018 CLINICAL DATA:  Initial evaluation for worsening left arm numbness with blurry vision in left eye. History of multiple sclerosis. EXAM: MRI HEAD AND ORBITS WITHOUT AND WITH CONTRAST MRI CERVICAL SPINE WITHOUT AND WITH CONTRAST TECHNIQUE: Multiplanar, multiecho pulse sequences of the brain and surrounding structures were obtained without and with intravenous contrast. Multiplanar, multiecho pulse sequences of the orbits and surrounding structures were obtained including fat saturation techniques, before and after intravenous contrast administration. CONTRAST:  10 CC OF GADAVIST. COMPARISON:  None available. FINDINGS: MRI HEAD FINDINGS Brain: Single 6 mm focus of diffusion abnormality seen at the left paramedian aspect of the pontomedullary junction (series 5, image 43). Associated somewhat vague T2/FLAIR signal abnormality (series 11, image 8). Lesion demonstrates postcontrast enhancement (series 46, image 12). Finding is nonspecific, and could reflect a small focus of acute demyelination or possibly subacute infarct. Appearance of the brain is otherwise normal. Cerebral volume within normal limits. Few additional punctate T2/FLAIR hyperintensities noted within the supratentorial cerebral white matter, nonspecific. No other evidence for acute or subacute infarct. No encephalomalacia to suggest chronic cortical infarction. No foci of susceptibility artifact to suggest acute or chronic intracranial hemorrhage. No mass lesion, midline shift or mass effect. No hydrocephalus. No extra-axial fluid collection. Incidental  note made of an empty sella. No other abnormal enhancement. Vascular: Major intracranial vascular flow voids maintained. Skull and upper cervical spine: Craniocervical junction normal. No focal marrow replacing lesion. Scalp soft tissues unremarkable. Other: No mastoid effusion.  Inner ear structures normal. MRI ORBITS FINDINGS Orbits: Globes are symmetric in size with normal appearance and morphology bilaterally. Optic nerves symmetric. No optic nerve edema or enhancement to suggest acute optic neuritis. Extra-ocular muscles within normal limits bilaterally. Intraconal and extraconal fat well-maintained. Lacrimal glands normal. Superior orbital veins symmetric and normal. No abnormality at the orbital apices. Visualized sinuses: Visualized paranasal sinuses are clear. Soft tissues: Visualized periorbital soft tissues unremarkable. MRI CERVICAL SPINE FINDINGS Alignment: Mild levoscoliosis with straightening of the normal cervical lordosis. No listhesis. Vertebra: Vertebral body heights maintained without evidence for acute or chronic fracture. Bone marrow signal intensity within normal limits. No worrisome osseous lesions. No abnormal marrow edema. Cord: Subtle patchy T2 signal abnormality within the right aspect of the cord at the level of C2-3 (series 32, image 10). Additional subtle possible cord signal abnormality within the right hemi cord at C3-4 (series 32, image 17). No other cord signal abnormality. No abnormal cord expansion or enhancement to suggest active demyelination. Paraspinous soft tissues: Craniocervical junction normal. Paraspinous and prevertebral soft tissues within normal limits. Normal intravascular flow voids seen within the vertebral arteries bilaterally. Disc levels:  C2-C3: Unremarkable. C3-C4: Right eccentric disc osteophyte complex with right greater than left uncovertebral hypertrophy. Flattening and indentation of the right ventral thecal sac with mild flattening of the right hemi cord.  Mild spinal stenosis. Mild to moderate right C4 foraminal stenosis. C4-C5: Mild diffuse disc bulge with bilateral uncovertebral hypertrophy. No spinal stenosis. Mild right C5 foraminal narrowing. C5-C6: Diffuse disc bulge with right greater than left uncovertebral hypertrophy. Flattening and partial effacement of the ventral thecal sac with resultant moderate spinal stenosis. Mild flattening of the ventral cord. Severe right with moderate left C6 foraminal stenosis. C6-C7: Diffuse disc bulge, slightly asymmetric to the left. Bilateral uncovertebral hypertrophy. Flattening of the ventral CSF and resultant mild spinal stenosis without cord deformity. Foramina remain patent. C7-T1:  Unremarkable. Visualized upper thoracic spine demonstrates no significant finding. IMPRESSION: MRI HEAD IMPRESSION: 1. 6 mm focus of diffusion abnormality with associated enhancement involving the left paramedian pontomedullary junction. Finding is nonspecific, and could reflect a small focus of acute demyelination. A focal subacute ischemic infarct would be the primary differential consideration. Either way, finding could produce patient's symptoms. 2. No other acute intracranial abnormality. 3. Empty sella. MRI ORBITS IMPRESSION: Normal MRI of the orbits. No evidence for acute optic neuritis or other abnormality. MRI CERVICAL IMPRESSION: 1. Question 2 small foci of cord signal abnormality within the right hemi cord at the level of C2-3 and C3-4 as above, suggestive for possible underlying demyelinating disease. No evidence for active demyelination. 2. Degenerative disc osteophyte at C5-6 with resultant moderate canal with severe right and moderate left C6 foraminal narrowing. 3. Right eccentric disc osteophyte at C3-4 with resultant mild canal with mild to moderate right C4 foraminal stenosis. 4. Mild right C5 foraminal stenosis related uncovertebral hypertrophy. Electronically Signed   By: Rise Mu M.D.   On: 08/27/2018 04:44    Mr Cervical Spine W Or Wo Contrast  Result Date: 08/27/2018 CLINICAL DATA:  Initial evaluation for worsening left arm numbness with blurry vision in left eye. History of multiple sclerosis. EXAM: MRI HEAD AND ORBITS WITHOUT AND WITH CONTRAST MRI CERVICAL SPINE WITHOUT AND WITH CONTRAST TECHNIQUE: Multiplanar, multiecho pulse sequences of the brain and surrounding structures were obtained without and with intravenous contrast. Multiplanar, multiecho pulse sequences of the orbits and surrounding structures were obtained including fat saturation techniques, before and after intravenous contrast administration. CONTRAST:  10 CC OF GADAVIST. COMPARISON:  None available. FINDINGS: MRI HEAD FINDINGS Brain: Single 6 mm focus of diffusion abnormality seen at the left paramedian aspect of the pontomedullary junction (series 5, image 43). Associated somewhat vague T2/FLAIR signal abnormality (series 11, image 8). Lesion demonstrates postcontrast enhancement (series 46, image 12). Finding is nonspecific, and could reflect a small focus of acute demyelination or possibly subacute infarct. Appearance of the brain is otherwise normal. Cerebral volume within normal limits. Few additional punctate T2/FLAIR hyperintensities noted within the supratentorial cerebral white matter, nonspecific. No other evidence for acute or subacute infarct. No encephalomalacia to suggest chronic cortical infarction. No foci of susceptibility artifact to suggest acute or chronic intracranial hemorrhage. No mass lesion, midline shift or mass effect. No hydrocephalus. No extra-axial fluid collection. Incidental note made of an empty sella. No other abnormal enhancement. Vascular: Major intracranial vascular flow voids maintained. Skull and upper cervical spine: Craniocervical junction normal. No focal marrow replacing lesion. Scalp soft tissues unremarkable. Other: No mastoid effusion.  Inner ear structures normal. MRI ORBITS FINDINGS Orbits: Globes  are symmetric in size with normal appearance and morphology bilaterally. Optic nerves  symmetric. No optic nerve edema or enhancement to suggest acute optic neuritis. Extra-ocular muscles within normal limits bilaterally. Intraconal and extraconal fat well-maintained. Lacrimal glands normal. Superior orbital veins symmetric and normal. No abnormality at the orbital apices. Visualized sinuses: Visualized paranasal sinuses are clear. Soft tissues: Visualized periorbital soft tissues unremarkable. MRI CERVICAL SPINE FINDINGS Alignment: Mild levoscoliosis with straightening of the normal cervical lordosis. No listhesis. Vertebra: Vertebral body heights maintained without evidence for acute or chronic fracture. Bone marrow signal intensity within normal limits. No worrisome osseous lesions. No abnormal marrow edema. Cord: Subtle patchy T2 signal abnormality within the right aspect of the cord at the level of C2-3 (series 32, image 10). Additional subtle possible cord signal abnormality within the right hemi cord at C3-4 (series 32, image 17). No other cord signal abnormality. No abnormal cord expansion or enhancement to suggest active demyelination. Paraspinous soft tissues: Craniocervical junction normal. Paraspinous and prevertebral soft tissues within normal limits. Normal intravascular flow voids seen within the vertebral arteries bilaterally. Disc levels: C2-C3: Unremarkable. C3-C4: Right eccentric disc osteophyte complex with right greater than left uncovertebral hypertrophy. Flattening and indentation of the right ventral thecal sac with mild flattening of the right hemi cord. Mild spinal stenosis. Mild to moderate right C4 foraminal stenosis. C4-C5: Mild diffuse disc bulge with bilateral uncovertebral hypertrophy. No spinal stenosis. Mild right C5 foraminal narrowing. C5-C6: Diffuse disc bulge with right greater than left uncovertebral hypertrophy. Flattening and partial effacement of the ventral thecal sac with  resultant moderate spinal stenosis. Mild flattening of the ventral cord. Severe right with moderate left C6 foraminal stenosis. C6-C7: Diffuse disc bulge, slightly asymmetric to the left. Bilateral uncovertebral hypertrophy. Flattening of the ventral CSF and resultant mild spinal stenosis without cord deformity. Foramina remain patent. C7-T1:  Unremarkable. Visualized upper thoracic spine demonstrates no significant finding. IMPRESSION: MRI HEAD IMPRESSION: 1. 6 mm focus of diffusion abnormality with associated enhancement involving the left paramedian pontomedullary junction. Finding is nonspecific, and could reflect a small focus of acute demyelination. A focal subacute ischemic infarct would be the primary differential consideration. Either way, finding could produce patient's symptoms. 2. No other acute intracranial abnormality. 3. Empty sella. MRI ORBITS IMPRESSION: Normal MRI of the orbits. No evidence for acute optic neuritis or other abnormality. MRI CERVICAL IMPRESSION: 1. Question 2 small foci of cord signal abnormality within the right hemi cord at the level of C2-3 and C3-4 as above, suggestive for possible underlying demyelinating disease. No evidence for active demyelination. 2. Degenerative disc osteophyte at C5-6 with resultant moderate canal with severe right and moderate left C6 foraminal narrowing. 3. Right eccentric disc osteophyte at C3-4 with resultant mild canal with mild to moderate right C4 foraminal stenosis. 4. Mild right C5 foraminal stenosis related uncovertebral hypertrophy. Electronically Signed   By: Rise Mu M.D.   On: 08/27/2018 04:44   Mr [place] UE Contrast  Result Date: 08/27/2018 CLINICAL DATA:  Initial evaluation for worsening left arm numbness with blurry vision in left eye. History of multiple sclerosis. EXAM: MRI HEAD AND ORBITS WITHOUT AND WITH CONTRAST MRI CERVICAL SPINE WITHOUT AND WITH CONTRAST TECHNIQUE: Multiplanar, multiecho pulse sequences of the  brain and surrounding structures were obtained without and with intravenous contrast. Multiplanar, multiecho pulse sequences of the orbits and surrounding structures were obtained including fat saturation techniques, before and after intravenous contrast administration. CONTRAST:  10 CC OF GADAVIST. COMPARISON:  None available. FINDINGS: MRI HEAD FINDINGS Brain: Single 6 mm focus of diffusion abnormality seen at the  left paramedian aspect of the pontomedullary junction (series 5, image 43). Associated somewhat vague T2/FLAIR signal abnormality (series 11, image 8). Lesion demonstrates postcontrast enhancement (series 46, image 12). Finding is nonspecific, and could reflect a small focus of acute demyelination or possibly subacute infarct. Appearance of the brain is otherwise normal. Cerebral volume within normal limits. Few additional punctate T2/FLAIR hyperintensities noted within the supratentorial cerebral white matter, nonspecific. No other evidence for acute or subacute infarct. No encephalomalacia to suggest chronic cortical infarction. No foci of susceptibility artifact to suggest acute or chronic intracranial hemorrhage. No mass lesion, midline shift or mass effect. No hydrocephalus. No extra-axial fluid collection. Incidental note made of an empty sella. No other abnormal enhancement. Vascular: Major intracranial vascular flow voids maintained. Skull and upper cervical spine: Craniocervical junction normal. No focal marrow replacing lesion. Scalp soft tissues unremarkable. Other: No mastoid effusion.  Inner ear structures normal. MRI ORBITS FINDINGS Orbits: Globes are symmetric in size with normal appearance and morphology bilaterally. Optic nerves symmetric. No optic nerve edema or enhancement to suggest acute optic neuritis. Extra-ocular muscles within normal limits bilaterally. Intraconal and extraconal fat well-maintained. Lacrimal glands normal. Superior orbital veins symmetric and normal. No  abnormality at the orbital apices. Visualized sinuses: Visualized paranasal sinuses are clear. Soft tissues: Visualized periorbital soft tissues unremarkable. MRI CERVICAL SPINE FINDINGS Alignment: Mild levoscoliosis with straightening of the normal cervical lordosis. No listhesis. Vertebra: Vertebral body heights maintained without evidence for acute or chronic fracture. Bone marrow signal intensity within normal limits. No worrisome osseous lesions. No abnormal marrow edema. Cord: Subtle patchy T2 signal abnormality within the right aspect of the cord at the level of C2-3 (series 32, image 10). Additional subtle possible cord signal abnormality within the right hemi cord at C3-4 (series 32, image 17). No other cord signal abnormality. No abnormal cord expansion or enhancement to suggest active demyelination. Paraspinous soft tissues: Craniocervical junction normal. Paraspinous and prevertebral soft tissues within normal limits. Normal intravascular flow voids seen within the vertebral arteries bilaterally. Disc levels: C2-C3: Unremarkable. C3-C4: Right eccentric disc osteophyte complex with right greater than left uncovertebral hypertrophy. Flattening and indentation of the right ventral thecal sac with mild flattening of the right hemi cord. Mild spinal stenosis. Mild to moderate right C4 foraminal stenosis. C4-C5: Mild diffuse disc bulge with bilateral uncovertebral hypertrophy. No spinal stenosis. Mild right C5 foraminal narrowing. C5-C6: Diffuse disc bulge with right greater than left uncovertebral hypertrophy. Flattening and partial effacement of the ventral thecal sac with resultant moderate spinal stenosis. Mild flattening of the ventral cord. Severe right with moderate left C6 foraminal stenosis. C6-C7: Diffuse disc bulge, slightly asymmetric to the left. Bilateral uncovertebral hypertrophy. Flattening of the ventral CSF and resultant mild spinal stenosis without cord deformity. Foramina remain patent.  C7-T1:  Unremarkable. Visualized upper thoracic spine demonstrates no significant finding. IMPRESSION: MRI HEAD IMPRESSION: 1. 6 mm focus of diffusion abnormality with associated enhancement involving the left paramedian pontomedullary junction. Finding is nonspecific, and could reflect a small focus of acute demyelination. A focal subacute ischemic infarct would be the primary differential consideration. Either way, finding could produce patient's symptoms. 2. No other acute intracranial abnormality. 3. Empty sella. MRI ORBITS IMPRESSION: Normal MRI of the orbits. No evidence for acute optic neuritis or other abnormality. MRI CERVICAL IMPRESSION: 1. Question 2 small foci of cord signal abnormality within the right hemi cord at the level of C2-3 and C3-4 as above, suggestive for possible underlying demyelinating disease. No evidence for active demyelination. 2.  Degenerative disc osteophyte at C5-6 with resultant moderate canal with severe right and moderate left C6 foraminal narrowing. 3. Right eccentric disc osteophyte at C3-4 with resultant mild canal with mild to moderate right C4 foraminal stenosis. 4. Mild right C5 foraminal stenosis related uncovertebral hypertrophy. Electronically Signed   By: Rise Mu M.D.   On: 08/27/2018 04:44    Procedures Procedures (including critical care time)  Medications Ordered in ED Medications  LORazepam (ATIVAN) injection 1 mg (has no administration in time range)     Initial Impression / Assessment and Plan / ED Course  I have reviewed the triage vital signs and the nursing notes.  Pertinent labs & imaging results that were available during my care of the patient were reviewed by me and considered in my medical decision making (see chart for details).    Patient with double vision and right-sided numbness for the past 2 days.  Concern for MS exacerbation.  MRI does show possible area of active demyelination of the pontomedullary junction.  Could  also be subacute ischemic infarct per radiology.  Episodes of demyelination in the C-spine as well but do not appear to be active.  Discussed with Dr. Jerrell Belfast of neurology who will evaluate patient.  Agrees with starting IV steroids and medical admission.  D/w Dr. Toniann Fail    Final Clinical Impressions(s) / ED Diagnoses   Final diagnoses:  Multiple sclerosis exacerbation [facility])    ED Discharge Orders    None       Sheranda Seabrooks, Jeannett Senior, MD 08/27/18 [phone]

## 2018-08-28 LAB — CBC
HCT: 41.4 % (ref 36.0–46.0)
HEMOGLOBIN: 13.7 g/dL (ref 12.0–15.0)
MCH: 31.8 pg (ref 26.0–34.0)
MCHC: 33.1 g/dL (ref 30.0–36.0)
MCV: 96.1 fL (ref 78.0–100.0)
Platelets: 264 10*3/uL (ref 150–400)
RBC: 4.31 MIL/uL (ref 3.87–5.11)
RDW: 12.4 % (ref 11.5–15.5)
WBC: 16.2 10*3/uL — ABNORMAL HIGH (ref 4.0–10.5)

## 2018-08-28 LAB — BASIC METABOLIC PANEL
ANION GAP: 6 (ref 5–15)
BUN: 7 mg/dL (ref 6–20)
CALCIUM: 9.1 mg/dL (ref 8.9–10.3)
CO2: 24 mmol/L (ref 22–32)
Chloride: 108 mmol/L (ref 98–111)
Creatinine, Ser: 0.89 mg/dL (ref 0.44–1.00)
GFR calc Af Amer: >60 mL/min (ref >60)
GFR calc non Af Amer: >60 mL/min (ref >60)
GLUCOSE: 136 mg/dL — AB (ref 70–99)
Potassium: 3.6 mmol/L (ref 3.5–5.1)
Sodium: 138 mmol/L (ref 135–145)

## 2018-08-28 LAB — HIV ANTIBODY (ROUTINE TESTING W REFLEX): HIV Screen 4th Generation wRfx: NONREACTIVE

## 2018-08-28 MED ORDER — SENNOSIDES-DOCUSATE SODIUM 8.6-50 MG PO TABS
2.0000 | ORAL_TABLET | Freq: Every day | ORAL | Status: DC
  Administered 2018-08-28 – 2018-08-30 (×3): 2 via ORAL
  Filled 2018-08-28 (×3): qty 2

## 2018-08-28 MED ORDER — ZOLPIDEM TARTRATE 5 MG PO TABS
5.0000 mg | ORAL_TABLET | Freq: Every evening | ORAL | Status: DC | PRN
  Administered 2018-08-28 – 2018-08-29 (×2): 5 mg via ORAL
  Filled 2018-08-28 (×2): qty 1

## 2018-08-28 NOTE — Evaluation (Signed)
Physical Therapy Evaluation & Discharge Patient Details Name: Jennifer Arias MRN: [id number] DOB: 02-22-1981 Today's Date: 08/28/2018   History of Present Illness  Pt is a 37 y.o. female with PMH of MS presenting with possible flare. She reports diplopia, and numbness/tingling in R side. MRI of brain orbits and C-spine showing acute demyelinating lesion and pt started on solu-medrol 1 dose. Neurology reports likely an acute exacerbation of relapsing/remitting MS. PMH includes HTN.     Clinical Impression  Patient evaluated by Physical Therapy with no further acute PT needs identified. PTA, pt indep and works. Today, pt indep with transfers and ambulation. DGI 22/24 indicates decreased risk of falls with higher level balance activities. Discussed importance of mobility, energy conservation and temperature regulation, in addition to future outpatient PT if disease progression affects mobility/balance/etc. All education has been completed and the patient has no further questions. PT is signing off. Thank you for this referral.    Follow Up Recommendations No PT follow up    Equipment Recommendations  None recommended by PT    Recommendations for Other Services       Precautions / Restrictions Precautions Precautions: None Restrictions Weight Bearing Restrictions: No      Mobility  Bed Mobility Overal bed mobility: Independent                Transfers Overall transfer level: Independent               General transfer comment: no assist required  Ambulation/Gait Ambulation/Gait assistance: Independent   Assistive device: None Gait Pattern/deviations: Step-through pattern;Drifts right/left   Gait velocity interpretation: 1.31 - 2.62 ft/sec, indicative of limited community ambulator General Gait Details: Intermittently drift to R-side, but pt able to self-correct without cues  Stairs Stairs: (Can high march in hallway without UE support)          Wheelchair  Mobility    Modified Rankin (Stroke Patients Only)       Balance Overall balance assessment: Needs assistance   Sitting balance-Leahy Scale: Normal       Standing balance-Leahy Scale: Good                 High Level Balance Comments: Self-corrected posterior LOB when picking up object from floor Standardized Balance Assessment Standardized Balance Assessment : Dynamic Gait Index   Dynamic Gait Index Level Surface: Mild Impairment Change in Gait Speed: Normal Gait with Horizontal Head Turns: Normal Gait with Vertical Head Turns: Normal Gait and Pivot Turn: Normal Step Over Obstacle: Normal Step Around Obstacles: Normal Steps: Mild Impairment Total Score: 22       Pertinent Vitals/Pain Pain Assessment: No/denies pain    Home Living Family/patient expects to be discharged to:: Private residence Living Arrangements: Non-relatives/Friends Available Help at Discharge: Friend(s);Available PRN/intermittently Type of Home: House Home Access: Stairs to enter Entrance Stairs-Rails: Doctor, general practice of Steps: 4 Home Layout: One level Home Equipment: None      Prior Function Level of Independence: Independent         Comments: independent with IADLs, + driving, working 30QMV hrs/week      Hand Dominance   Dominant Hand: Right    Extremity/Trunk Assessment   Upper Extremity Assessment Upper Extremity Assessment: RUE deficits/detail;LUE deficits/detail RUE Deficits / Details: Grossly 5/5 MMT, reports numbness/tingling in RUE RUE Sensation: WNL(numbness/tingling) RUE Coordination: WNL LUE Deficits / Details: Grossly 5/5 MMT LUE Sensation: WNL LUE Coordination: WNL    Lower Extremity Assessment Lower Extremity Assessment: Overall WFL for tasks  assessed    Cervical / Trunk Assessment Cervical / Trunk Assessment: Normal  Communication   Communication: No difficulties  Cognition Arousal/Alertness: Awake/alert Behavior During  Therapy: WFL for tasks assessed/performed Overall Cognitive Status: Within Functional Limits for tasks assessed                                        General Comments General comments (skin integrity, edema, etc.): Discussed potential for outpatient neuro PT if disease progresses and affects mobility/balance/etc.     Exercises     Assessment/Plan    PT Assessment Patent does not need any further PT services  PT Problem List         PT Treatment Interventions      PT Goals (Current goals can be found in the Care Plan section)  Acute Rehab PT Goals Patient Stated Goal: to get back home  PT Goal Formulation: All assessment and education complete, DC therapy    Frequency     Barriers to discharge        Co-evaluation               AM-PAC PT "6 Clicks" Daily Activity  Outcome Measure Difficulty turning over in bed (including adjusting bedclothes, sheets and blankets)?: None Difficulty moving from lying on back to sitting on the side of the bed? : None Difficulty sitting down on and standing up from a chair with arms (e.g., wheelchair, bedside commode, etc,.)?: None Help needed moving to and from a bed to chair (including a wheelchair)?: None Help needed walking in hospital room?: None Help needed climbing 3-5 steps with a railing? : None 6 Click Score: 24    End of Session Equipment Utilized During Treatment: Gait belt Activity Tolerance: Patient tolerated treatment well Patient left: in chair;with call bell/phone within reach Nurse Communication: Mobility status PT Visit Diagnosis: Other abnormalities of gait and mobility (R26.89)    Time: 1133-1150 PT Time Calculation (min) (ACUTE ONLY): 17 min   Charges:   PT Evaluation $PT Eval Low Complexity: 1 Low         Ina Homes, PT, DPT Acute Rehabilitation Services  Pager [phone] Office [phone]  Malachy Chamber 08/28/2018, 1:18 PM

## 2018-08-28 NOTE — Evaluation (Addendum)
Occupational Therapy Evaluation and Discharge Patient Details Name: Jennifer Arias MRN: [id number] DOB: [birth year] Today's Date: 08/28/2018    History of Present Illness Pt is a 37 y.o. female with medical history significant of MS presenting with possible flare. She reports diplopia, and numbness/tingling in R side. MRI of the brain orbits and C-spine showing acute demyelinating lesion and pt started on solu-medrol 1 dose. Neurology reports likely  an acute exacerbation of relapsing/remitting MS.  PMH: HTN.    Clinical Impression   PTA patient independent and driving.  She was admitted for above and limited by blurry vision and R sided numbness/tingling.  Patient reports diplopia has resolved and she has slightly blurry vision that clears if she focuses on objects, slight decreased visual field impairment at L superior quadrant (on L side) but patient demonstrates good compensation with head turn as needed functionally.  Demonstrates modified independent level with ADLs and transfers.  At this time, no further OT needs are identified and OT will sign off. If further OT needs arise, please re-consult.     Follow Up Recommendations  No OT follow up;Supervision - Intermittent    Equipment Recommendations  None recommended by OT    Recommendations for Other Services PT consult     Precautions / Restrictions Precautions Precautions: None Restrictions Weight Bearing Restrictions: No      Mobility Bed Mobility Overal bed mobility: Independent                Transfers Overall transfer level: Modified independent               General transfer comment: no assist required    Balance Overall balance assessment: No apparent balance deficits (not formally assessed)                                         ADL either performed or assessed with clinical judgement   ADL Overall ADL's : Modified independent                                        General ADL Comments: Pt demonstrates ability to complete grooming standing, dressing, bathing and toileting with modified independence.  Completed shower prior to therapist entering without physical assist.     Vision Baseline Vision/History: Wears glasses Wears Glasses: At all times Patient Visual Report: Other (comment)(double vision initally, gone now but blurry vision.) Vision Assessment?: Yes Eye Alignment: Within Functional Limits Ocular Range of Motion: Within Functional Limits(reports discomfort towards L side, but tolerable ) Alignment/Gaze Preference: Within Defined Limits Tracking/Visual Pursuits: Able to track stimulus in all quads without difficulty Saccades: Within functional limits Convergence: Within functional limits Visual Fields: Other (comment)(mild deficits on L superior quadrent on L side)     Perception     Praxis      Pertinent Vitals/Pain Pain Assessment: No/denies pain     Hand Dominance Right   Extremity/Trunk Assessment Upper Extremity Assessment Upper Extremity Assessment: RUE deficits/detail;LUE deficits/detail RUE Deficits / Details: Grossly 5/5 MMT, reports numbness/tingling  RUE Sensation: WNL(numbness/tingling) RUE Coordination: WNL LUE Deficits / Details: Grossly 5/5 MMT LUE Sensation: WNL LUE Coordination: WNL   Lower Extremity Assessment Lower Extremity Assessment: Defer to PT evaluation   Cervical / Trunk Assessment Cervical / Trunk Assessment: Normal   Communication Communication Communication: No  difficulties   Cognition Arousal/Alertness: Awake/alert Behavior During Therapy: WFL for tasks assessed/performed Overall Cognitive Status: Within Functional Limits for tasks assessed                                     General Comments       Exercises     Shoulder Instructions      Home Living Family/patient expects to be discharged to:: Private residence Living Arrangements:  Non-relatives/Friends Available Help at Discharge: Available PRN/intermittently Type of Home: House Home Access: Stairs to enter [employer] of Steps: 4 Entrance Stairs-Rails: Right;Left Home Layout: One level     Bathroom Shower/Tub: Chief Strategy Officer: Standard     Home Equipment: None          Prior Functioning/Environment Level of Independence: Independent        Comments: independent with IADLs, + driving, working 16[employer] hrs/week         OT Problem List: Impaired vision/perception;Impaired sensation      OT Treatment/Interventions:      OT Goals(Current goals can be found in the care plan section) Acute Rehab OT Goals Patient Stated Goal: to get back home  OT Goal Formulation: With patient  OT Frequency:     Barriers to D/C:            Co-evaluation              AM-PAC PT "6 Clicks" Daily Activity     Outcome Measure Help from another person eating meals?: None Help from another person taking care of personal grooming?: None Help from another person toileting, which includes using toliet, bedpan, or urinal?: None Help from another person bathing (including washing, rinsing, drying)?: None Help from another person to put on and taking off regular upper body clothing?: None Help from another person to put on and taking off regular lower body clothing?: None 6 Click Score: 24   End of Session    Activity Tolerance: Patient tolerated treatment well Patient left: Other (comment);with call bell/phone within reach(seated EOB, handoff to PT )  OT Visit Diagnosis: Other symptoms and signs involving the nervous system (R29.898)                Time: 1114-1130 OT Time Calculation (min): 16 min Charges:  OT General Charges $OT Visit: 1 Visit OT Evaluation $OT Eval Low Complexity: 1 Low  Chancy Milroy, OT Acute Rehabilitation Services Pager [phone] Office [phone]   Chancy Milroy 08/28/2018, 11:50 AM

## 2018-08-28 NOTE — Progress Notes (Signed)
Patient Demographics:    Jennifer Arias, is a 37 y.o. female, DOB - 09/26/1981, ORV:[id number]  Admit date - 08/26/2018   Admitting Physician Jonah Blue, MD  Outpatient Primary MD for the patient is Patient, No Pcp Per  LOS - 1   Chief Complaint  Patient presents with  . Tingling        Subjective:    Jennifer Arias today has no fevers, no emesis,  No chest pain, resting comfortably  Assessment  & Plan :    Principal Problem:   Multiple sclerosis exacerbation (HCC) Active Problems:   Essential hypertension    1) MS flareup--- IV Solu-Medrol 1 g daily x5 days, neurology input appreciated, visual problems and right-sided numbness appears to be improving  2) Elevated BP--- in the setting of high-dose IV steroid,   may use IV Hydralazine 10 mg  Every 4 hours Prn for systolic blood pressure over 160 mmhg    Disposition/Need for in-Hospital Stay- patient unable to be discharged at this time due to requires IV Solu-Medrol for multiple sclerosis flareup  Code Status : full    Disposition Plan  : home  Consults  :  neuro   DVT Prophylaxis  :  Lovenox  Lab Results  Component Value Date   PLT 264 08/28/2018    Inpatient Medications  Scheduled Meds: . enoxaparin (LOVENOX) injection  40 mg Subcutaneous Q24H  . senna-docusate  2 tablet Oral QHS   Continuous Infusions: . lactated ringers 75 mL/hr at 08/28/18 0300  . methylPREDNISolone (SOLU-MEDROL) injection 1,000 mg (08/28/18 0523)   PRN Meds:.acetaminophen **OR** acetaminophen, ondansetron **OR** ondansetron (ZOFRAN) IV    Anti-infectives (From admission, onward)   None        Objective:   Vitals:   08/28/18 0421 08/28/18 0730 08/28/18 1145 08/28/18 1536  BP: (!) 143/93 (!) 157/93 130/83 (!) 152/100  Pulse: 74 64 93 90  Resp: 18 18 18 18   Temp: 97.8 F (36.6 C) 98.2 F (36.8 C) 98.1 F (36.7 C) 98.5 F (36.9 C)    TempSrc: Oral Oral Oral Oral  SpO2: 100%  99% 99%  Weight:      Height:        Wt Readings from Last 3 Encounters:  08/27/18 120.5 kg     Intake/Output Summary (Last 24 hours) at 08/28/2018 1923 Last data filed at 08/28/2018 1700 Gross per 24 hour  Intake 1300 ml  Output 850 ml  Net 450 ml     Physical Exam Patient is examined daily including today on 08/28/18 , exams remain the same as of yesterday except that has changed   Gen:- Awake Alert, no acute distress HEENT:- [place].AT, No sclera icterus Neck-Supple Neck,No JVD,.  Lungs-  CTAB , good air movement CV- S1, S2 normal Abd-  +ve B.Sounds, Abd Soft, No tenderness,    Extremity/Skin:- No  edema,   good pulses Psych-affect is appropriate, oriented x3 Neuro-visual defects and right-sided numbness appears to be improving,  no tremors   Data Review:   Micro Results No results found for this or any previous visit (from the past 240 hour(s)).  Radiology Reports Mr Jennifer Arias And Wo Contrast  Result Date: 08/27/2018 CLINICAL DATA:  Initial evaluation for worsening left arm numbness  with blurry vision in left eye. History of multiple sclerosis. EXAM: MRI HEAD AND ORBITS WITHOUT AND WITH CONTRAST MRI CERVICAL SPINE WITHOUT AND WITH CONTRAST TECHNIQUE: Multiplanar, multiecho pulse sequences of the brain and surrounding structures were obtained without and with intravenous contrast. Multiplanar, multiecho pulse sequences of the orbits and surrounding structures were obtained including fat saturation techniques, before and after intravenous contrast administration. CONTRAST:  10 CC OF GADAVIST. COMPARISON:  None available. FINDINGS: MRI HEAD FINDINGS Brain: Single 6 mm focus of diffusion abnormality seen at the left paramedian aspect of the pontomedullary junction (series 5, image 43). Associated somewhat vague T2/FLAIR signal abnormality (series 11, image 8). Lesion demonstrates postcontrast enhancement (series 46, image 12). Finding is  nonspecific, and could reflect a small focus of acute demyelination or possibly subacute infarct. Appearance of the brain is otherwise normal. Cerebral volume within normal limits. Few additional punctate T2/FLAIR hyperintensities noted within the supratentorial cerebral white matter, nonspecific. No other evidence for acute or subacute infarct. No encephalomalacia to suggest chronic cortical infarction. No foci of susceptibility artifact to suggest acute or chronic intracranial hemorrhage. No mass lesion, midline shift or mass effect. No hydrocephalus. No extra-axial fluid collection. Incidental note made of an empty sella. No other abnormal enhancement. Vascular: Major intracranial vascular flow voids maintained. Skull and upper cervical spine: Craniocervical junction normal. No focal marrow replacing lesion. Scalp soft tissues unremarkable. Other: No mastoid effusion.  Inner ear structures normal. MRI ORBITS FINDINGS Orbits: Globes are symmetric in size with normal appearance and morphology bilaterally. Optic nerves symmetric. No optic nerve edema or enhancement to suggest acute optic neuritis. Extra-ocular muscles within normal limits bilaterally. Intraconal and extraconal fat well-maintained. Lacrimal glands normal. Superior orbital veins symmetric and normal. No abnormality at the orbital apices. Visualized sinuses: Visualized paranasal sinuses are clear. Soft tissues: Visualized periorbital soft tissues unremarkable. MRI CERVICAL SPINE FINDINGS Alignment: Mild levoscoliosis with straightening of the normal cervical lordosis. No listhesis. Vertebra: Vertebral body heights maintained without evidence for acute or chronic fracture. Bone marrow signal intensity within normal limits. No worrisome osseous lesions. No abnormal marrow edema. Cord: Subtle patchy T2 signal abnormality within the right aspect of the cord at the level of C2-3 (series 32, image 10). Additional subtle possible cord signal abnormality  within the right hemi cord at C3-4 (series 32, image 17). No other cord signal abnormality. No abnormal cord expansion or enhancement to suggest active demyelination. Paraspinous soft tissues: Craniocervical junction normal. Paraspinous and prevertebral soft tissues within normal limits. Normal intravascular flow voids seen within the vertebral arteries bilaterally. Disc levels: C2-C3: Unremarkable. C3-C4: Right eccentric disc osteophyte complex with right greater than left uncovertebral hypertrophy. Flattening and indentation of the right ventral thecal sac with mild flattening of the right hemi cord. Mild spinal stenosis. Mild to moderate right C4 foraminal stenosis. C4-C5: Mild diffuse disc bulge with bilateral uncovertebral hypertrophy. No spinal stenosis. Mild right C5 foraminal narrowing. C5-C6: Diffuse disc bulge with right greater than left uncovertebral hypertrophy. Flattening and partial effacement of the ventral thecal sac with resultant moderate spinal stenosis. Mild flattening of the ventral cord. Severe right with moderate left C6 foraminal stenosis. C6-C7: Diffuse disc bulge, slightly asymmetric to the left. Bilateral uncovertebral hypertrophy. Flattening of the ventral CSF and resultant mild spinal stenosis without cord deformity. Foramina remain patent. C7-T1:  Unremarkable. Visualized upper thoracic spine demonstrates no significant finding. IMPRESSION: MRI HEAD IMPRESSION: 1. 6 mm focus of diffusion abnormality with associated enhancement involving the left paramedian pontomedullary junction. Finding is nonspecific,  and could reflect a small focus of acute demyelination. A focal subacute ischemic infarct would be the primary differential consideration. Either way, finding could produce patient's symptoms. 2. No other acute intracranial abnormality. 3. Empty sella. MRI ORBITS IMPRESSION: Normal MRI of the orbits. No evidence for acute optic neuritis or other abnormality. MRI CERVICAL IMPRESSION: 1.  Question 2 small foci of cord signal abnormality within the right hemi cord at the level of C2-3 and C3-4 as above, suggestive for possible underlying demyelinating disease. No evidence for active demyelination. 2. Degenerative disc osteophyte at C5-6 with resultant moderate canal with severe right and moderate left C6 foraminal narrowing. 3. Right eccentric disc osteophyte at C3-4 with resultant mild canal with mild to moderate right C4 foraminal stenosis. 4. Mild right C5 foraminal stenosis related uncovertebral hypertrophy. Electronically Signed   By: Rise Mu M.D.   On: 08/27/2018 04:44   Mr Cervical Spine W Or Wo Contrast  Result Date: 08/27/2018 CLINICAL DATA:  Initial evaluation for worsening left arm numbness with blurry vision in left eye. History of multiple sclerosis. EXAM: MRI HEAD AND ORBITS WITHOUT AND WITH CONTRAST MRI CERVICAL SPINE WITHOUT AND WITH CONTRAST TECHNIQUE: Multiplanar, multiecho pulse sequences of the brain and surrounding structures were obtained without and with intravenous contrast. Multiplanar, multiecho pulse sequences of the orbits and surrounding structures were obtained including fat saturation techniques, before and after intravenous contrast administration. CONTRAST:  10 CC OF GADAVIST. COMPARISON:  None available. FINDINGS: MRI HEAD FINDINGS Brain: Single 6 mm focus of diffusion abnormality seen at the left paramedian aspect of the pontomedullary junction (series 5, image 43). Associated somewhat vague T2/FLAIR signal abnormality (series 11, image 8). Lesion demonstrates postcontrast enhancement (series 46, image 12). Finding is nonspecific, and could reflect a small focus of acute demyelination or possibly subacute infarct. Appearance of the brain is otherwise normal. Cerebral volume within normal limits. Few additional punctate T2/FLAIR hyperintensities noted within the supratentorial cerebral white matter, nonspecific. No other evidence for acute or subacute  infarct. No encephalomalacia to suggest chronic cortical infarction. No foci of susceptibility artifact to suggest acute or chronic intracranial hemorrhage. No mass lesion, midline shift or mass effect. No hydrocephalus. No extra-axial fluid collection. Incidental note made of an empty sella. No other abnormal enhancement. Vascular: Major intracranial vascular flow voids maintained. Skull and upper cervical spine: Craniocervical junction normal. No focal marrow replacing lesion. Scalp soft tissues unremarkable. Other: No mastoid effusion.  Inner ear structures normal. MRI ORBITS FINDINGS Orbits: Globes are symmetric in size with normal appearance and morphology bilaterally. Optic nerves symmetric. No optic nerve edema or enhancement to suggest acute optic neuritis. Extra-ocular muscles within normal limits bilaterally. Intraconal and extraconal fat well-maintained. Lacrimal glands normal. Superior orbital veins symmetric and normal. No abnormality at the orbital apices. Visualized sinuses: Visualized paranasal sinuses are clear. Soft tissues: Visualized periorbital soft tissues unremarkable. MRI CERVICAL SPINE FINDINGS Alignment: Mild levoscoliosis with straightening of the normal cervical lordosis. No listhesis. Vertebra: Vertebral body heights maintained without evidence for acute or chronic fracture. Bone marrow signal intensity within normal limits. No worrisome osseous lesions. No abnormal marrow edema. Cord: Subtle patchy T2 signal abnormality within the right aspect of the cord at the level of C2-3 (series 32, image 10). Additional subtle possible cord signal abnormality within the right hemi cord at C3-4 (series 32, image 17). No other cord signal abnormality. No abnormal cord expansion or enhancement to suggest active demyelination. Paraspinous soft tissues: Craniocervical junction normal. Paraspinous and prevertebral soft tissues within  normal limits. Normal intravascular flow voids seen within the  vertebral arteries bilaterally. Disc levels: C2-C3: Unremarkable. C3-C4: Right eccentric disc osteophyte complex with right greater than left uncovertebral hypertrophy. Flattening and indentation of the right ventral thecal sac with mild flattening of the right hemi cord. Mild spinal stenosis. Mild to moderate right C4 foraminal stenosis. C4-C5: Mild diffuse disc bulge with bilateral uncovertebral hypertrophy. No spinal stenosis. Mild right C5 foraminal narrowing. C5-C6: Diffuse disc bulge with right greater than left uncovertebral hypertrophy. Flattening and partial effacement of the ventral thecal sac with resultant moderate spinal stenosis. Mild flattening of the ventral cord. Severe right with moderate left C6 foraminal stenosis. C6-C7: Diffuse disc bulge, slightly asymmetric to the left. Bilateral uncovertebral hypertrophy. Flattening of the ventral CSF and resultant mild spinal stenosis without cord deformity. Foramina remain patent. C7-T1:  Unremarkable. Visualized upper thoracic spine demonstrates no significant finding. IMPRESSION: MRI HEAD IMPRESSION: 1. 6 mm focus of diffusion abnormality with associated enhancement involving the left paramedian pontomedullary junction. Finding is nonspecific, and could reflect a small focus of acute demyelination. A focal subacute ischemic infarct would be the primary differential consideration. Either way, finding could produce patient's symptoms. 2. No other acute intracranial abnormality. 3. Empty sella. MRI ORBITS IMPRESSION: Normal MRI of the orbits. No evidence for acute optic neuritis or other abnormality. MRI CERVICAL IMPRESSION: 1. Question 2 small foci of cord signal abnormality within the right hemi cord at the level of C2-3 and C3-4 as above, suggestive for possible underlying demyelinating disease. No evidence for active demyelination. 2. Degenerative disc osteophyte at C5-6 with resultant moderate canal with severe right and moderate left C6 foraminal  narrowing. 3. Right eccentric disc osteophyte at C3-4 with resultant mild canal with mild to moderate right C4 foraminal stenosis. 4. Mild right C5 foraminal stenosis related uncovertebral hypertrophy. Electronically Signed   By: Rise Mu M.D.   On: 08/27/2018 04:44   Mr [place] Contrast  Result Date: 08/27/2018 CLINICAL DATA:  Initial evaluation for worsening left arm numbness with blurry vision in left eye. History of multiple sclerosis. EXAM: MRI HEAD AND ORBITS WITHOUT AND WITH CONTRAST MRI CERVICAL SPINE WITHOUT AND WITH CONTRAST TECHNIQUE: Multiplanar, multiecho pulse sequences of the brain and surrounding structures were obtained without and with intravenous contrast. Multiplanar, multiecho pulse sequences of the orbits and surrounding structures were obtained including fat saturation techniques, before and after intravenous contrast administration. CONTRAST:  10 CC OF GADAVIST. COMPARISON:  None available. FINDINGS: MRI HEAD FINDINGS Brain: Single 6 mm focus of diffusion abnormality seen at the left paramedian aspect of the pontomedullary junction (series 5, image 43). Associated somewhat vague T2/FLAIR signal abnormality (series 11, image 8). Lesion demonstrates postcontrast enhancement (series 46, image 12). Finding is nonspecific, and could reflect a small focus of acute demyelination or possibly subacute infarct. Appearance of the brain is otherwise normal. Cerebral volume within normal limits. Few additional punctate T2/FLAIR hyperintensities noted within the supratentorial cerebral white matter, nonspecific. No other evidence for acute or subacute infarct. No encephalomalacia to suggest chronic cortical infarction. No foci of susceptibility artifact to suggest acute or chronic intracranial hemorrhage. No mass lesion, midline shift or mass effect. No hydrocephalus. No extra-axial fluid collection. Incidental note made of an empty sella. No other abnormal enhancement. Vascular: Major  intracranial vascular flow voids maintained. Skull and upper cervical spine: Craniocervical junction normal. No focal marrow replacing lesion. Scalp soft tissues unremarkable. Other: No mastoid effusion.  Inner ear structures normal. MRI ORBITS FINDINGS Orbits: Globes  are symmetric in size with normal appearance and morphology bilaterally. Optic nerves symmetric. No optic nerve edema or enhancement to suggest acute optic neuritis. Extra-ocular muscles within normal limits bilaterally. Intraconal and extraconal fat well-maintained. Lacrimal glands normal. Superior orbital veins symmetric and normal. No abnormality at the orbital apices. Visualized sinuses: Visualized paranasal sinuses are clear. Soft tissues: Visualized periorbital soft tissues unremarkable. MRI CERVICAL SPINE FINDINGS Alignment: Mild levoscoliosis with straightening of the normal cervical lordosis. No listhesis. Vertebra: Vertebral body heights maintained without evidence for acute or chronic fracture. Bone marrow signal intensity within normal limits. No worrisome osseous lesions. No abnormal marrow edema. Cord: Subtle patchy T2 signal abnormality within the right aspect of the cord at the level of C2-3 (series 32, image 10). Additional subtle possible cord signal abnormality within the right hemi cord at C3-4 (series 32, image 17). No other cord signal abnormality. No abnormal cord expansion or enhancement to suggest active demyelination. Paraspinous soft tissues: Craniocervical junction normal. Paraspinous and prevertebral soft tissues within normal limits. Normal intravascular flow voids seen within the vertebral arteries bilaterally. Disc levels: C2-C3: Unremarkable. C3-C4: Right eccentric disc osteophyte complex with right greater than left uncovertebral hypertrophy. Flattening and indentation of the right ventral thecal sac with mild flattening of the right hemi cord. Mild spinal stenosis. Mild to moderate right C4 foraminal stenosis. C4-C5:  Mild diffuse disc bulge with bilateral uncovertebral hypertrophy. No spinal stenosis. Mild right C5 foraminal narrowing. C5-C6: Diffuse disc bulge with right greater than left uncovertebral hypertrophy. Flattening and partial effacement of the ventral thecal sac with resultant moderate spinal stenosis. Mild flattening of the ventral cord. Severe right with moderate left C6 foraminal stenosis. C6-C7: Diffuse disc bulge, slightly asymmetric to the left. Bilateral uncovertebral hypertrophy. Flattening of the ventral CSF and resultant mild spinal stenosis without cord deformity. Foramina remain patent. C7-T1:  Unremarkable. Visualized upper thoracic spine demonstrates no significant finding. IMPRESSION: MRI HEAD IMPRESSION: 1. 6 mm focus of diffusion abnormality with associated enhancement involving the left paramedian pontomedullary junction. Finding is nonspecific, and could reflect a small focus of acute demyelination. A focal subacute ischemic infarct would be the primary differential consideration. Either way, finding could produce patient's symptoms. 2. No other acute intracranial abnormality. 3. Empty sella. MRI ORBITS IMPRESSION: Normal MRI of the orbits. No evidence for acute optic neuritis or other abnormality. MRI CERVICAL IMPRESSION: 1. Question 2 small foci of cord signal abnormality within the right hemi cord at the level of C2-3 and C3-4 as above, suggestive for possible underlying demyelinating disease. No evidence for active demyelination. 2. Degenerative disc osteophyte at C5-6 with resultant moderate canal with severe right and moderate left C6 foraminal narrowing. 3. Right eccentric disc osteophyte at C3-4 with resultant mild canal with mild to moderate right C4 foraminal stenosis. 4. Mild right C5 foraminal stenosis related uncovertebral hypertrophy. Electronically Signed   By: Rise Mu M.D.   On: 08/27/2018 04:44     CBC Recent Labs  Lab 08/26/18 2017 08/28/18 0618  WBC 8.1  16.2*  HGB 13.4 13.7  HCT 41.4 41.4  PLT 270 264  MCV 97.6 96.1  MCH 31.6 31.8  MCHC 32.4 33.1  RDW 12.7 12.4    Chemistries  Recent Labs  Lab 08/26/18 2017 08/28/18 0618  NA 142 138  K 3.6 3.6  CL 108 108  CO2 24 24  GLUCOSE 104* 136*  BUN 9 7  CREATININE 0.99 0.89  CALCIUM 9.0 9.1   ------------------------------------------------------------------------------------------------------------------ No results for input(s): CHOL, HDL, LDLCALC,  TRIG, CHOLHDL, LDLDIRECT in the last 72 hours.  No results found for: HGBA1C ------------------------------------------------------------------------------------------------------------------ No results for input(s): TSH, T4TOTAL, T3FREE, THYROIDAB in the last 72 hours.  Invalid input(s): FREET3 ------------------------------------------------------------------------------------------------------------------ No results for input(s): VITAMINB12, FOLATE, FERRITIN, TIBC, IRON, RETICCTPCT in the last 72 hours.  Coagulation profile No results for input(s): INR, PROTIME in the last 168 hours.  No results for input(s): DDIMER in the last 72 hours.  Cardiac Enzymes No results for input(s): CKMB, TROPONINI, MYOGLOBIN in the last 168 hours.  Invalid input(s): CK ------------------------------------------------------------------------------------------------------------------ No results found for: BNP   Shon Hale M.D on 08/28/2018 at 7:23 PM  Pager---[phone] Go to [email or web address] - password [email or web address] for contact info  [facility] - Office  [phone]

## 2018-08-29 MED ORDER — HYDROXYZINE HCL 25 MG PO TABS
25.0000 mg | ORAL_TABLET | Freq: Every day | ORAL | Status: DC
  Administered 2018-08-29 – 2018-08-30 (×2): 25 mg via ORAL
  Filled 2018-08-29 (×2): qty 1

## 2018-08-29 NOTE — Progress Notes (Signed)
Patient Demographics:    Jennifer Arias, is a 37 y.o. female, DOB - 03/06/1981, ONG:[id number]  Admit date - 08/26/2018   Admitting Physician Jennifer Blue, MD  Outpatient Primary MD for the patient is Patient, No Pcp Per  LOS - 2   Chief Complaint  Patient presents with  . Tingling        Subjective:    Jennifer Arias today has no fevers, no emesis,  No chest pain, slept okay last night with Ambien no new complaints  Assessment  & Plan :    Principal Problem:   Multiple sclerosis exacerbation (HCC) Active Problems:   Essential hypertension  Brief summary 37 year old female with past medical history relevant for relapsing multiple sclerosis as well as history of elevated blood pressure and obesity admitted on 08/27/18 with concerns of flareup of her MS.  Prior to her presentation she had diplopia and tingling numbness in her limbs for 3 days.  MRI of C-spine and MRI of brain suggested possible MS along C2-C3 and C3-C4, neurology recommended IV Solu-Medrol 1 g daily for 5 days.  Plan:-  1) MS flareup--- IV Solu-Medrol 1 g daily x5 days, neurology input appreciated, visual problems and right-sided numbness appears to be improving, Okay to discharge on Sunday 08/31/2018 after fifth dose of IV Solu-Medrol.  Patient unable to be discharged at this time as she has no PCP and no insurance so cannot have steroids as outpatient  2) Elevated BP--- in the setting of high-dose IV steroid,  may use IV Hydralazine 10 mg  Every 4 hours Prn for systolic blood pressure over 160 mmhg  3) insomnia--- may be related to high-dose steroids, may use Atarax 25 mg nightly scheduled may have Ambien as needed 5 mg if needed  Disposition/Need for in-Hospital Stay- patient unable to be discharged at this time due to requires IV Solu-Medrol for multiple sclerosis flareup, Patient unable to be discharged at this time as she has no PCP  and no insurance so cannot have steroids as outpatient   Code Status : full    Disposition Plan  : home  Consults  :  neuro   DVT Prophylaxis  :  Lovenox  Lab Results  Component Value Date   PLT 264 08/28/2018    Inpatient Medications  Scheduled Meds: . enoxaparin (LOVENOX) injection  40 mg Subcutaneous Q24H  . hydrOXYzine  25 mg Oral QHS  . senna-docusate  2 tablet Oral QHS   Continuous Infusions: . lactated ringers 75 mL/hr at 08/29/18 1100  . methylPREDNISolone (SOLU-MEDROL) injection Stopped (08/29/18 0627)   PRN Meds:.acetaminophen **OR** acetaminophen, ondansetron **OR** ondansetron (ZOFRAN) IV, zolpidem    Anti-infectives (From admission, onward)   None        Objective:   Vitals:   08/29/18 0020 08/29/18 0344 08/29/18 0810 08/29/18 1231  BP: (!) 155/89 (!) 154/97 (!) 164/86 (!) 150/99  Pulse: 81 88 77 74  Resp: 18 18 18 19   Temp: 98 F (36.7 C) 98.2 F (36.8 C) 98.4 F (36.9 C) 97.9 F (36.6 C)  TempSrc: Oral Oral Oral Oral  SpO2: 100% 100% 100% 98%  Weight:      Height:        Wt Readings from Last 3 Encounters:  08/27/18  120.5 kg     Intake/Output Summary (Last 24 hours) at 08/29/2018 1823 Last data filed at 08/29/2018 1700 Gross per 24 hour  Intake 2604.71 ml  Output 1200 ml  Net 1404.71 ml     Physical Exam Patient is examined daily including today on 08/29/18 , exams remain the same as of yesterday except that has changed   Gen:- Awake Alert, no acute distress HEENT:- [place].AT, No sclera icterus Neck-Supple Neck,No JVD,.  Lungs-  CTAB , good air movement CV- S1, S2 normal, regular Abd-  +ve B.Sounds, Abd Soft, No tenderness,    Extremity/Skin:- No  edema,   good pulses Psych-affect is appropriate, oriented x3 Neuro-visual defects and right-sided numbness appears to be improving,  no tremors, ambulating in the hallways, gait is steady   Data Review:   Micro Results No results found for this or any previous visit (from the  past 240 hour(s)).  Radiology Reports Mr [name] And Wo Contrast  Result Date: 08/27/2018 CLINICAL DATA:  Initial evaluation for worsening left arm numbness with blurry vision in left eye. History of multiple sclerosis. EXAM: MRI HEAD AND ORBITS WITHOUT AND WITH CONTRAST MRI CERVICAL SPINE WITHOUT AND WITH CONTRAST TECHNIQUE: Multiplanar, multiecho pulse sequences of the brain and surrounding structures were obtained without and with intravenous contrast. Multiplanar, multiecho pulse sequences of the orbits and surrounding structures were obtained including fat saturation techniques, before and after intravenous contrast administration. CONTRAST:  10 CC OF GADAVIST. COMPARISON:  None available. FINDINGS: MRI HEAD FINDINGS Brain: Single 6 mm focus of diffusion abnormality seen at the left paramedian aspect of the pontomedullary junction (series 5, image 43). Associated somewhat vague T2/FLAIR signal abnormality (series 11, image 8). Lesion demonstrates postcontrast enhancement (series 46, image 12). Finding is nonspecific, and could reflect a small focus of acute demyelination or possibly subacute infarct. Appearance of the brain is otherwise normal. Cerebral volume within normal limits. Few additional punctate T2/FLAIR hyperintensities noted within the supratentorial cerebral white matter, nonspecific. No other evidence for acute or subacute infarct. No encephalomalacia to suggest chronic cortical infarction. No foci of susceptibility artifact to suggest acute or chronic intracranial hemorrhage. No mass lesion, midline shift or mass effect. No hydrocephalus. No extra-axial fluid collection. Incidental note made of an empty sella. No other abnormal enhancement. Vascular: Major intracranial vascular flow voids maintained. Skull and upper cervical spine: Craniocervical junction normal. No focal marrow replacing lesion. Scalp soft tissues unremarkable. Other: No mastoid effusion.  Inner ear structures normal. MRI  ORBITS FINDINGS Orbits: Globes are symmetric in size with normal appearance and morphology bilaterally. Optic nerves symmetric. No optic nerve edema or enhancement to suggest acute optic neuritis. Extra-ocular muscles within normal limits bilaterally. Intraconal and extraconal fat well-maintained. Lacrimal glands normal. Superior orbital veins symmetric and normal. No abnormality at the orbital apices. Visualized sinuses: Visualized paranasal sinuses are clear. Soft tissues: Visualized periorbital soft tissues unremarkable. MRI CERVICAL SPINE FINDINGS Alignment: Mild levoscoliosis with straightening of the normal cervical lordosis. No listhesis. Vertebra: Vertebral body heights maintained without evidence for acute or chronic fracture. Bone marrow signal intensity within normal limits. No worrisome osseous lesions. No abnormal marrow edema. Cord: Subtle patchy T2 signal abnormality within the right aspect of the cord at the level of C2-3 (series 32, image 10). Additional subtle possible cord signal abnormality within the right hemi cord at C3-4 (series 32, image 17). No other cord signal abnormality. No abnormal cord expansion or enhancement to suggest active demyelination. Paraspinous soft tissues: Craniocervical junction normal. Paraspinous  and prevertebral soft tissues within normal limits. Normal intravascular flow voids seen within the vertebral arteries bilaterally. Disc levels: C2-C3: Unremarkable. C3-C4: Right eccentric disc osteophyte complex with right greater than left uncovertebral hypertrophy. Flattening and indentation of the right ventral thecal sac with mild flattening of the right hemi cord. Mild spinal stenosis. Mild to moderate right C4 foraminal stenosis. C4-C5: Mild diffuse disc bulge with bilateral uncovertebral hypertrophy. No spinal stenosis. Mild right C5 foraminal narrowing. C5-C6: Diffuse disc bulge with right greater than left uncovertebral hypertrophy. Flattening and partial effacement  of the ventral thecal sac with resultant moderate spinal stenosis. Mild flattening of the ventral cord. Severe right with moderate left C6 foraminal stenosis. C6-C7: Diffuse disc bulge, slightly asymmetric to the left. Bilateral uncovertebral hypertrophy. Flattening of the ventral CSF and resultant mild spinal stenosis without cord deformity. Foramina remain patent. C7-T1:  Unremarkable. Visualized upper thoracic spine demonstrates no significant finding. IMPRESSION: MRI HEAD IMPRESSION: 1. 6 mm focus of diffusion abnormality with associated enhancement involving the left paramedian pontomedullary junction. Finding is nonspecific, and could reflect a small focus of acute demyelination. A focal subacute ischemic infarct would be the primary differential consideration. Either way, finding could produce patient's symptoms. 2. No other acute intracranial abnormality. 3. Empty sella. MRI ORBITS IMPRESSION: Normal MRI of the orbits. No evidence for acute optic neuritis or other abnormality. MRI CERVICAL IMPRESSION: 1. Question 2 small foci of cord signal abnormality within the right hemi cord at the level of C2-3 and C3-4 as above, suggestive for possible underlying demyelinating disease. No evidence for active demyelination. 2. Degenerative disc osteophyte at C5-6 with resultant moderate canal with severe right and moderate left C6 foraminal narrowing. 3. Right eccentric disc osteophyte at C3-4 with resultant mild canal with mild to moderate right C4 foraminal stenosis. 4. Mild right C5 foraminal stenosis related uncovertebral hypertrophy. Electronically Signed   By: Rise Mu M.D.   On: 08/27/2018 04:44   Mr Cervical Spine W Or Wo Contrast  Result Date: 08/27/2018 CLINICAL DATA:  Initial evaluation for worsening left arm numbness with blurry vision in left eye. History of multiple sclerosis. EXAM: MRI HEAD AND ORBITS WITHOUT AND WITH CONTRAST MRI CERVICAL SPINE WITHOUT AND WITH CONTRAST TECHNIQUE:  Multiplanar, multiecho pulse sequences of the brain and surrounding structures were obtained without and with intravenous contrast. Multiplanar, multiecho pulse sequences of the orbits and surrounding structures were obtained including fat saturation techniques, before and after intravenous contrast administration. CONTRAST:  10 CC OF GADAVIST. COMPARISON:  None available. FINDINGS: MRI HEAD FINDINGS Brain: Single 6 mm focus of diffusion abnormality seen at the left paramedian aspect of the pontomedullary junction (series 5, image 43). Associated somewhat vague T2/FLAIR signal abnormality (series 11, image 8). Lesion demonstrates postcontrast enhancement (series 46, image 12). Finding is nonspecific, and could reflect a small focus of acute demyelination or possibly subacute infarct. Appearance of the brain is otherwise normal. Cerebral volume within normal limits. Few additional punctate T2/FLAIR hyperintensities noted within the supratentorial cerebral white matter, nonspecific. No other evidence for acute or subacute infarct. No encephalomalacia to suggest chronic cortical infarction. No foci of susceptibility artifact to suggest acute or chronic intracranial hemorrhage. No mass lesion, midline shift or mass effect. No hydrocephalus. No extra-axial fluid collection. Incidental note made of an empty sella. No other abnormal enhancement. Vascular: Major intracranial vascular flow voids maintained. Skull and upper cervical spine: Craniocervical junction normal. No focal marrow replacing lesion. Scalp soft tissues unremarkable. Other: No mastoid effusion.  Inner ear structures  normal. MRI ORBITS FINDINGS Orbits: Globes are symmetric in size with normal appearance and morphology bilaterally. Optic nerves symmetric. No optic nerve edema or enhancement to suggest acute optic neuritis. Extra-ocular muscles within normal limits bilaterally. Intraconal and extraconal fat well-maintained. Lacrimal glands normal. Superior  orbital veins symmetric and normal. No abnormality at the orbital apices. Visualized sinuses: Visualized paranasal sinuses are clear. Soft tissues: Visualized periorbital soft tissues unremarkable. MRI CERVICAL SPINE FINDINGS Alignment: Mild levoscoliosis with straightening of the normal cervical lordosis. No listhesis. Vertebra: Vertebral body heights maintained without evidence for acute or chronic fracture. Bone marrow signal intensity within normal limits. No worrisome osseous lesions. No abnormal marrow edema. Cord: Subtle patchy T2 signal abnormality within the right aspect of the cord at the level of C2-3 (series 32, image 10). Additional subtle possible cord signal abnormality within the right hemi cord at C3-4 (series 32, image 17). No other cord signal abnormality. No abnormal cord expansion or enhancement to suggest active demyelination. Paraspinous soft tissues: Craniocervical junction normal. Paraspinous and prevertebral soft tissues within normal limits. Normal intravascular flow voids seen within the vertebral arteries bilaterally. Disc levels: C2-C3: Unremarkable. C3-C4: Right eccentric disc osteophyte complex with right greater than left uncovertebral hypertrophy. Flattening and indentation of the right ventral thecal sac with mild flattening of the right hemi cord. Mild spinal stenosis. Mild to moderate right C4 foraminal stenosis. C4-C5: Mild diffuse disc bulge with bilateral uncovertebral hypertrophy. No spinal stenosis. Mild right C5 foraminal narrowing. C5-C6: Diffuse disc bulge with right greater than left uncovertebral hypertrophy. Flattening and partial effacement of the ventral thecal sac with resultant moderate spinal stenosis. Mild flattening of the ventral cord. Severe right with moderate left C6 foraminal stenosis. C6-C7: Diffuse disc bulge, slightly asymmetric to the left. Bilateral uncovertebral hypertrophy. Flattening of the ventral CSF and resultant mild spinal stenosis without cord  deformity. Foramina remain patent. C7-T1:  Unremarkable. Visualized upper thoracic spine demonstrates no significant finding. IMPRESSION: MRI HEAD IMPRESSION: 1. 6 mm focus of diffusion abnormality with associated enhancement involving the left paramedian pontomedullary junction. Finding is nonspecific, and could reflect a small focus of acute demyelination. A focal subacute ischemic infarct would be the primary differential consideration. Either way, finding could produce patient's symptoms. 2. No other acute intracranial abnormality. 3. Empty sella. MRI ORBITS IMPRESSION: Normal MRI of the orbits. No evidence for acute optic neuritis or other abnormality. MRI CERVICAL IMPRESSION: 1. Question 2 small foci of cord signal abnormality within the right hemi cord at the level of C2-3 and C3-4 as above, suggestive for possible underlying demyelinating disease. No evidence for active demyelination. 2. Degenerative disc osteophyte at C5-6 with resultant moderate canal with severe right and moderate left C6 foraminal narrowing. 3. Right eccentric disc osteophyte at C3-4 with resultant mild canal with mild to moderate right C4 foraminal stenosis. 4. Mild right C5 foraminal stenosis related uncovertebral hypertrophy. Electronically Signed   By: Rise Mu M.D.   On: 08/27/2018 04:44   Mr [place] ZO Contrast  Result Date: 08/27/2018 CLINICAL DATA:  Initial evaluation for worsening left arm numbness with blurry vision in left eye. History of multiple sclerosis. EXAM: MRI HEAD AND ORBITS WITHOUT AND WITH CONTRAST MRI CERVICAL SPINE WITHOUT AND WITH CONTRAST TECHNIQUE: Multiplanar, multiecho pulse sequences of the brain and surrounding structures were obtained without and with intravenous contrast. Multiplanar, multiecho pulse sequences of the orbits and surrounding structures were obtained including fat saturation techniques, before and after intravenous contrast administration. CONTRAST:  10 CC OF GADAVIST.  COMPARISON:  None available. FINDINGS: MRI HEAD FINDINGS Brain: Single 6 mm focus of diffusion abnormality seen at the left paramedian aspect of the pontomedullary junction (series 5, image 43). Associated somewhat vague T2/FLAIR signal abnormality (series 11, image 8). Lesion demonstrates postcontrast enhancement (series 46, image 12). Finding is nonspecific, and could reflect a small focus of acute demyelination or possibly subacute infarct. Appearance of the brain is otherwise normal. Cerebral volume within normal limits. Few additional punctate T2/FLAIR hyperintensities noted within the supratentorial cerebral white matter, nonspecific. No other evidence for acute or subacute infarct. No encephalomalacia to suggest chronic cortical infarction. No foci of susceptibility artifact to suggest acute or chronic intracranial hemorrhage. No mass lesion, midline shift or mass effect. No hydrocephalus. No extra-axial fluid collection. Incidental note made of an empty sella. No other abnormal enhancement. Vascular: Major intracranial vascular flow voids maintained. Skull and upper cervical spine: Craniocervical junction normal. No focal marrow replacing lesion. Scalp soft tissues unremarkable. Other: No mastoid effusion.  Inner ear structures normal. MRI ORBITS FINDINGS Orbits: Globes are symmetric in size with normal appearance and morphology bilaterally. Optic nerves symmetric. No optic nerve edema or enhancement to suggest acute optic neuritis. Extra-ocular muscles within normal limits bilaterally. Intraconal and extraconal fat well-maintained. Lacrimal glands normal. Superior orbital veins symmetric and normal. No abnormality at the orbital apices. Visualized sinuses: Visualized paranasal sinuses are clear. Soft tissues: Visualized periorbital soft tissues unremarkable. MRI CERVICAL SPINE FINDINGS Alignment: Mild levoscoliosis with straightening of the normal cervical lordosis. No listhesis. Vertebra: Vertebral body  heights maintained without evidence for acute or chronic fracture. Bone marrow signal intensity within normal limits. No worrisome osseous lesions. No abnormal marrow edema. Cord: Subtle patchy T2 signal abnormality within the right aspect of the cord at the level of C2-3 (series 32, image 10). Additional subtle possible cord signal abnormality within the right hemi cord at C3-4 (series 32, image 17). No other cord signal abnormality. No abnormal cord expansion or enhancement to suggest active demyelination. Paraspinous soft tissues: Craniocervical junction normal. Paraspinous and prevertebral soft tissues within normal limits. Normal intravascular flow voids seen within the vertebral arteries bilaterally. Disc levels: C2-C3: Unremarkable. C3-C4: Right eccentric disc osteophyte complex with right greater than left uncovertebral hypertrophy. Flattening and indentation of the right ventral thecal sac with mild flattening of the right hemi cord. Mild spinal stenosis. Mild to moderate right C4 foraminal stenosis. C4-C5: Mild diffuse disc bulge with bilateral uncovertebral hypertrophy. No spinal stenosis. Mild right C5 foraminal narrowing. C5-C6: Diffuse disc bulge with right greater than left uncovertebral hypertrophy. Flattening and partial effacement of the ventral thecal sac with resultant moderate spinal stenosis. Mild flattening of the ventral cord. Severe right with moderate left C6 foraminal stenosis. C6-C7: Diffuse disc bulge, slightly asymmetric to the left. Bilateral uncovertebral hypertrophy. Flattening of the ventral CSF and resultant mild spinal stenosis without cord deformity. Foramina remain patent. C7-T1:  Unremarkable. Visualized upper thoracic spine demonstrates no significant finding. IMPRESSION: MRI HEAD IMPRESSION: 1. 6 mm focus of diffusion abnormality with associated enhancement involving the left paramedian pontomedullary junction. Finding is nonspecific, and could reflect a small focus of acute  demyelination. A focal subacute ischemic infarct would be the primary differential consideration. Either way, finding could produce patient's symptoms. 2. No other acute intracranial abnormality. 3. Empty sella. MRI ORBITS IMPRESSION: Normal MRI of the orbits. No evidence for acute optic neuritis or other abnormality. MRI CERVICAL IMPRESSION: 1. Question 2 small foci of cord signal abnormality within the right hemi cord at the  level of C2-3 and C3-4 as above, suggestive for possible underlying demyelinating disease. No evidence for active demyelination. 2. Degenerative disc osteophyte at C5-6 with resultant moderate canal with severe right and moderate left C6 foraminal narrowing. 3. Right eccentric disc osteophyte at C3-4 with resultant mild canal with mild to moderate right C4 foraminal stenosis. 4. Mild right C5 foraminal stenosis related uncovertebral hypertrophy. Electronically Signed   By: Rise Mu M.D.   On: 08/27/2018 04:44     CBC Recent Labs  Lab 08/26/18 2017 08/28/18 0618  WBC 8.1 16.2*  HGB 13.4 13.7  HCT 41.4 41.4  PLT 270 264  MCV 97.6 96.1  MCH 31.6 31.8  MCHC 32.4 33.1  RDW 12.7 12.4    Chemistries  Recent Labs  Lab 08/26/18 2017 08/28/18 0618  NA 142 138  K 3.6 3.6  CL 108 108  CO2 24 24  GLUCOSE 104* 136*  BUN 9 7  CREATININE 0.99 0.89  CALCIUM 9.0 9.1   ------------------------------------------------------------------------------------------------------------------ No results for input(s): CHOL, HDL, LDLCALC, TRIG, CHOLHDL, LDLDIRECT in the last 72 hours.  No results found for: HGBA1C ------------------------------------------------------------------------------------------------------------------ No results for input(s): TSH, T4TOTAL, T3FREE, THYROIDAB in the last 72 hours.  Invalid input(s): FREET3 ------------------------------------------------------------------------------------------------------------------ No results for input(s):  VITAMINB12, FOLATE, FERRITIN, TIBC, IRON, RETICCTPCT in the last 72 hours.  Coagulation profile No results for input(s): INR, PROTIME in the last 168 hours.  No results for input(s): DDIMER in the last 72 hours.  Cardiac Enzymes No results for input(s): CKMB, TROPONINI, MYOGLOBIN in the last 168 hours.  Invalid input(s): CK ------------------------------------------------------------------------------------------------------------------ No results found for: BNP   Shon Hale M.D on 08/29/2018 at 6:23 PM  Pager---[phone] Go to [email or web address] - password [email or web address] for contact info  [facility] - Office  [phone]

## 2018-08-29 NOTE — Care Management Note (Signed)
Case Management Note  Patient Details  Name: Jennifer Arias MRN: [id number] Date of Birth: 08/12/1981  Subjective/Objective:   Pt admitted with MS exacerbation. She is from home with friends. She has been traveling back and forth from [place] where she has been assist her grandmother and where her sons are staying.  PCP: she currently doesn't have one Insurance: family planning medicaid                  Action/Plan: Pt will d/c home with self care. CM provided her information on the [facility] that can provide PCP and she can use [facility] pharmacy for assist with the cost of medications. No further needs per CM.  Expected Discharge Date:                  Expected Discharge Plan:  Home/Self Care  In-House Referral:     Discharge planning Services  CM Consult  Post Acute Care Choice:    Choice offered to:     DME Arranged:    DME Agency:     HH Arranged:    HH Agency:     Status of Service:  Completed, signed off  If discussed at [employer] of Stay Meetings, dates discussed:    Additional Comments:  Kermit Balo, RN 08/29/2018, 3:08 PM

## 2018-08-30 NOTE — Progress Notes (Addendum)
NEURO HOSPITALIST PROGRESS NOTE   Subjective: Patient is awake, alert, walking around. She states that she feels a lot better and her double vision is gone. Her vision is not all the way back to baseline, but is close per patient. She is noticing that she gets tired from walking sooner than she believes she should. The tingling in the hands has subsided, and the numbness has improved but still persist in the fingertips.  Exam: Vitals:   08/30/18 0358 08/30/18 0729  BP: 135/79 (!) 143/92  Pulse: (!) 56 (!) 56  Resp: 18 20  Temp: 98.4 F (36.9 C) 97.8 F (36.6 C)  SpO2: 97% 100%    Physical Exam   HEENT-  Normocephalic, no lesions, without obvious abnormality.  Normal external eye and conjunctiva.   Cardiovascular- S1-S2 audible, pulses palpable throughout   Lungs-no rhonchi or wheezing noted, no excessive working breathing.  Saturations within normal limits Abdomen- All 4 quadrants palpated and nontender Extremities- Warm, dry and intact Musculoskeletal-no joint tenderness, deformity or swelling Skin-warm and dry, no hyperpigmentation, vitiligo, or suspicious lesions   Neuro:  Mental Status: Alert, oriented/ person/place/year/month/ age. Naming intact, thought content appropriate.  Speech fluent without evidence of aphasia. No dysarthria. Able to follow  commands without difficulty. Cranial Nerves: II: Visual fields grossly normal,  III,IV, VI: ptosis not present, extra-ocular motions intact bilaterally pupils equal, round, reactive to light and accommodation V,VII: smile symmetric, facial light touch sensation normal bilaterally VIII: hearing normal bilaterally IX,X: uvula rises symmetrically XI: bilateral shoulder shrug XII: midline tongue extension Motor: Right : Upper extremity   5/5  Left:     Upper extremity   5/5  Lower extremity   5/5   Lower extremity   5/5 Tone and bulk:normal tone throughout; no atrophy noted Sensory:  light touch intact  throughout, bilaterally Deep Tendon Reflexes: 3+ and symmetric biceps, patellae No hoffman's noted. Plantars: Right: downgoing   Left: downgoing Cerebellar: normal finger-to-nose, normal rapid alternating movements and normal heel-to-shin test Gait: normal gait and station    Medications:  Scheduled: . enoxaparin (LOVENOX) injection  40 mg Subcutaneous Q24H  . hydrOXYzine  25 mg Oral QHS  . senna-docusate  2 tablet Oral QHS   Continuous: . lactated ringers 75 mL/hr at 08/30/18 0021  . methylPREDNISolone (SOLU-MEDROL) injection 1,000 mg (08/30/18 0544)   ZOX:[id number] **OR** acetaminophen, ondansetron **OR** ondansetron (ZOFRAN) IV, zolpidem  Pertinent Labs/Diagnostics:   Assessment:   Jennifer Arias is a 37 year old African-American woman with a PMHx of multiple sclerosis diagnosed in 2014, who now presents with right-sided tingling, numbness and blurred vision. 1. 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 2. Formally was taking Aubagio but stopped this medication of her own volition. Neurology team has reiterated the importance of getting back on a disease-modifying agent for the longer term to prevent accruing disability with relapses.  3. Currently treating with high-dose steroids for acute exacerbation of her MS.    Recommendations:  --continue IV solu-medrol 1g daily for 5 days (5th dose 08/31/18) --She will need to follow-up with outpatient neurology-Dr. Despina Arias at [facility] in 4 to 6 weeks after discharge. -Neurology to sign off at this time. Please call with any further questions or concerns.   Jennifer Lucks, MSN,  NP-C [facility] [phone]   Electronically signed: Dr. Caryl Arias 08/30/2018, 7:46 AM

## 2018-08-30 NOTE — Progress Notes (Signed)
Patient Demographics:    Jennifer Arias, is a 37 y.o. female, DOB - 1981-07-29, ION:[id number]  Admit date - 08/26/2018   Admitting Physician Jonah Blue, MD  Outpatient Primary MD for the patient is Patient, No Pcp Per  LOS - 3   Chief Complaint  Patient presents with  . Tingling      Brief summary 37 year old female with past medical history relevant for relapsing multiple sclerosis as well as history of elevated blood pressure and obesity admitted on 08/27/18 with concerns of flareup of her MS.  Prior to her presentation she had diplopia and tingling numbness in her limbs for 3 days.  MRI of C-spine and MRI of brain suggested possible MS along C2-C3 and C3-C4, neurology recommended IV Solu-Medrol 1 g daily for 5 days.   Subjective:   She denies any complaints.  Her vision is improving.  Her strength is improved.  She is walking around in the room without any difficulties.  Assessment  & Plan :    Principal Problem:   Multiple sclerosis exacerbation (HCC) Active Problems:   Essential hypertension  1) MS exacerbation: Patient seen by neurology.  IV Solu-Medrol recommended for 5 days.  Today's day 4.  Patient's symptoms including visual impairment and right-sided numbness appear to be improving.  Patient has been ambulating without any difficulties.  She will get her last dose of Solu-Medrol tomorrow.  Will need to follow-up with neurology as an outpatient.  She prefers somebody here locally.  She can follow-up with Dr. Epimenio Foot with [facility] neurology.  2) Elevated BP--- in the setting of high-dose IV steroid,  may use IV Hydralazine 10 mg  Every 4 hours Prn for systolic blood pressure over 160 mmhg. blood pressure is stable.  3) insomnia--- may be related to high-dose steroids, may use Atarax 25 mg nightly scheduled may have Ambien as needed 5 mg if needed.  Disposition/Need for in-Hospital Stay- patient  unable to be discharged at this time due to requires IV Solu-Medrol for multiple sclerosis flareup, Patient unable to be discharged at this time as she has no PCP and no insurance so cannot have steroids as outpatient.  Anticipate discharge after her fifth dose of Solu-Medrol tomorrow morning.   Code Status : full    Disposition Plan  : home  Consults  :  neuro   DVT Prophylaxis  :  Lovenox  Lab Results  Component Value Date   PLT 264 08/28/2018    Inpatient Medications  Scheduled Meds: . enoxaparin (LOVENOX) injection  40 mg Subcutaneous Q24H  . hydrOXYzine  25 mg Oral QHS  . senna-docusate  2 tablet Oral QHS   Continuous Infusions: . lactated ringers 75 mL/hr at 08/30/18 0804  . methylPREDNISolone (SOLU-MEDROL) injection 1,000 mg (08/30/18 0544)   PRN Meds:.acetaminophen **OR** acetaminophen, ondansetron **OR** ondansetron (ZOFRAN) IV, zolpidem    Anti-infectives (From admission, onward)   None        Objective:   Vitals:   08/30/18 0020 08/30/18 0358 08/30/18 0729 08/30/18 1140  BP: (!) 153/89 135/79 (!) 143/92 132/81  Pulse: 73 (!) 56 (!) 56 73  Resp: 18 18 20 16   Temp: 98.4 F (36.9 C) 98.4 F (36.9 C) 97.8 F (36.6 C) 98 F (36.7 C)  TempSrc: Oral Oral Oral Oral  SpO2: 100% 97% 100% 97%  Weight:      Height:        Wt Readings from Last 3 Encounters:  08/27/18 120.5 kg     Intake/Output Summary (Last 24 hours) at 08/30/2018 1250 Last data filed at 08/29/2018 1700 Gross per 24 hour  Intake 1984.71 ml  Output -  Net 1984.71 ml     Physical Exam Gen:-Awake alert.  In no distress. Lungs-lungs are clear to auscultation bilaterally.  Normal effort. S1-S2 is normal regular.  No S3-S4.  No rubs murmurs or bruit Abdomen is soft.  Nontender nondistended.  No masses organomegaly.  Bowel sounds are present and normal. She is alert and oriented x3.  No obvious focal neurological deficits appreciated.     Data Review:   Radiology Reports Mr [name] And Wo Contrast  Result Date: 08/27/2018 CLINICAL DATA:  Initial evaluation for worsening left arm numbness with blurry vision in left eye. History of multiple sclerosis. EXAM: MRI HEAD AND ORBITS WITHOUT AND WITH CONTRAST MRI CERVICAL SPINE WITHOUT AND WITH CONTRAST TECHNIQUE: Multiplanar, multiecho pulse sequences of the brain and surrounding structures were obtained without and with intravenous contrast. Multiplanar, multiecho pulse sequences of the orbits and surrounding structures were obtained including fat saturation techniques, before and after intravenous contrast administration. CONTRAST:  10 CC OF GADAVIST. COMPARISON:  None available. FINDINGS: MRI HEAD FINDINGS Brain: Single 6 mm focus of diffusion abnormality seen at the left paramedian aspect of the pontomedullary junction (series 5, image 43). Associated somewhat vague T2/FLAIR signal abnormality (series 11, image 8). Lesion demonstrates postcontrast enhancement (series 46, image 12). Finding is nonspecific, and could reflect a small focus of acute demyelination or possibly subacute infarct. Appearance of the brain is otherwise normal. Cerebral volume within normal limits. Few additional punctate T2/FLAIR hyperintensities noted within the supratentorial cerebral white matter, nonspecific. No other evidence for acute or subacute infarct. No encephalomalacia to suggest chronic cortical infarction. No foci of susceptibility artifact to suggest acute or chronic intracranial hemorrhage. No mass lesion, midline shift or mass effect. No hydrocephalus. No extra-axial fluid collection. Incidental note made of an empty sella. No other abnormal enhancement. Vascular: Major intracranial vascular flow voids maintained. Skull and upper cervical spine: Craniocervical junction normal. No focal marrow replacing lesion. Scalp soft tissues unremarkable. Other: No mastoid effusion.  Inner ear structures normal. MRI ORBITS FINDINGS Orbits: Globes are symmetric in  size with normal appearance and morphology bilaterally. Optic nerves symmetric. No optic nerve edema or enhancement to suggest acute optic neuritis. Extra-ocular muscles within normal limits bilaterally. Intraconal and extraconal fat well-maintained. Lacrimal glands normal. Superior orbital veins symmetric and normal. No abnormality at the orbital apices. Visualized sinuses: Visualized paranasal sinuses are clear. Soft tissues: Visualized periorbital soft tissues unremarkable. MRI CERVICAL SPINE FINDINGS Alignment: Mild levoscoliosis with straightening of the normal cervical lordosis. No listhesis. Vertebra: Vertebral body heights maintained without evidence for acute or chronic fracture. Bone marrow signal intensity within normal limits. No worrisome osseous lesions. No abnormal marrow edema. Cord: Subtle patchy T2 signal abnormality within the right aspect of the cord at the level of C2-3 (series 32, image 10). Additional subtle possible cord signal abnormality within the right hemi cord at C3-4 (series 32, image 17). No other cord signal abnormality. No abnormal cord expansion or enhancement to suggest active demyelination. Paraspinous soft tissues: Craniocervical junction normal. Paraspinous and prevertebral soft tissues within normal limits. Normal intravascular flow voids seen within the vertebral arteries  bilaterally. Disc levels: C2-C3: Unremarkable. C3-C4: Right eccentric disc osteophyte complex with right greater than left uncovertebral hypertrophy. Flattening and indentation of the right ventral thecal sac with mild flattening of the right hemi cord. Mild spinal stenosis. Mild to moderate right C4 foraminal stenosis. C4-C5: Mild diffuse disc bulge with bilateral uncovertebral hypertrophy. No spinal stenosis. Mild right C5 foraminal narrowing. C5-C6: Diffuse disc bulge with right greater than left uncovertebral hypertrophy. Flattening and partial effacement of the ventral thecal sac with resultant moderate  spinal stenosis. Mild flattening of the ventral cord. Severe right with moderate left C6 foraminal stenosis. C6-C7: Diffuse disc bulge, slightly asymmetric to the left. Bilateral uncovertebral hypertrophy. Flattening of the ventral CSF and resultant mild spinal stenosis without cord deformity. Foramina remain patent. C7-T1:  Unremarkable. Visualized upper thoracic spine demonstrates no significant finding. IMPRESSION: MRI HEAD IMPRESSION: 1. 6 mm focus of diffusion abnormality with associated enhancement involving the left paramedian pontomedullary junction. Finding is nonspecific, and could reflect a small focus of acute demyelination. A focal subacute ischemic infarct would be the primary differential consideration. Either way, finding could produce patient's symptoms. 2. No other acute intracranial abnormality. 3. Empty sella. MRI ORBITS IMPRESSION: Normal MRI of the orbits. No evidence for acute optic neuritis or other abnormality. MRI CERVICAL IMPRESSION: 1. Question 2 small foci of cord signal abnormality within the right hemi cord at the level of C2-3 and C3-4 as above, suggestive for possible underlying demyelinating disease. No evidence for active demyelination. 2. Degenerative disc osteophyte at C5-6 with resultant moderate canal with severe right and moderate left C6 foraminal narrowing. 3. Right eccentric disc osteophyte at C3-4 with resultant mild canal with mild to moderate right C4 foraminal stenosis. 4. Mild right C5 foraminal stenosis related uncovertebral hypertrophy. Electronically Signed   By: Rise Mu M.D.   On: 08/27/2018 04:44   Mr Cervical Spine W Or Wo Contrast  Result Date: 08/27/2018 CLINICAL DATA:  Initial evaluation for worsening left arm numbness with blurry vision in left eye. History of multiple sclerosis. EXAM: MRI HEAD AND ORBITS WITHOUT AND WITH CONTRAST MRI CERVICAL SPINE WITHOUT AND WITH CONTRAST TECHNIQUE: Multiplanar, multiecho pulse sequences of the brain and  surrounding structures were obtained without and with intravenous contrast. Multiplanar, multiecho pulse sequences of the orbits and surrounding structures were obtained including fat saturation techniques, before and after intravenous contrast administration. CONTRAST:  10 CC OF GADAVIST. COMPARISON:  None available. FINDINGS: MRI HEAD FINDINGS Brain: Single 6 mm focus of diffusion abnormality seen at the left paramedian aspect of the pontomedullary junction (series 5, image 43). Associated somewhat vague T2/FLAIR signal abnormality (series 11, image 8). Lesion demonstrates postcontrast enhancement (series 46, image 12). Finding is nonspecific, and could reflect a small focus of acute demyelination or possibly subacute infarct. Appearance of the brain is otherwise normal. Cerebral volume within normal limits. Few additional punctate T2/FLAIR hyperintensities noted within the supratentorial cerebral white matter, nonspecific. No other evidence for acute or subacute infarct. No encephalomalacia to suggest chronic cortical infarction. No foci of susceptibility artifact to suggest acute or chronic intracranial hemorrhage. No mass lesion, midline shift or mass effect. No hydrocephalus. No extra-axial fluid collection. Incidental note made of an empty sella. No other abnormal enhancement. Vascular: Major intracranial vascular flow voids maintained. Skull and upper cervical spine: Craniocervical junction normal. No focal marrow replacing lesion. Scalp soft tissues unremarkable. Other: No mastoid effusion.  Inner ear structures normal. MRI ORBITS FINDINGS Orbits: Globes are symmetric in size with normal appearance and morphology bilaterally.  Optic nerves symmetric. No optic nerve edema or enhancement to suggest acute optic neuritis. Extra-ocular muscles within normal limits bilaterally. Intraconal and extraconal fat well-maintained. Lacrimal glands normal. Superior orbital veins symmetric and normal. No abnormality at the  orbital apices. Visualized sinuses: Visualized paranasal sinuses are clear. Soft tissues: Visualized periorbital soft tissues unremarkable. MRI CERVICAL SPINE FINDINGS Alignment: Mild levoscoliosis with straightening of the normal cervical lordosis. No listhesis. Vertebra: Vertebral body heights maintained without evidence for acute or chronic fracture. Bone marrow signal intensity within normal limits. No worrisome osseous lesions. No abnormal marrow edema. Cord: Subtle patchy T2 signal abnormality within the right aspect of the cord at the level of C2-3 (series 32, image 10). Additional subtle possible cord signal abnormality within the right hemi cord at C3-4 (series 32, image 17). No other cord signal abnormality. No abnormal cord expansion or enhancement to suggest active demyelination. Paraspinous soft tissues: Craniocervical junction normal. Paraspinous and prevertebral soft tissues within normal limits. Normal intravascular flow voids seen within the vertebral arteries bilaterally. Disc levels: C2-C3: Unremarkable. C3-C4: Right eccentric disc osteophyte complex with right greater than left uncovertebral hypertrophy. Flattening and indentation of the right ventral thecal sac with mild flattening of the right hemi cord. Mild spinal stenosis. Mild to moderate right C4 foraminal stenosis. C4-C5: Mild diffuse disc bulge with bilateral uncovertebral hypertrophy. No spinal stenosis. Mild right C5 foraminal narrowing. C5-C6: Diffuse disc bulge with right greater than left uncovertebral hypertrophy. Flattening and partial effacement of the ventral thecal sac with resultant moderate spinal stenosis. Mild flattening of the ventral cord. Severe right with moderate left C6 foraminal stenosis. C6-C7: Diffuse disc bulge, slightly asymmetric to the left. Bilateral uncovertebral hypertrophy. Flattening of the ventral CSF and resultant mild spinal stenosis without cord deformity. Foramina remain patent. C7-T1:  Unremarkable.  Visualized upper thoracic spine demonstrates no significant finding. IMPRESSION: MRI HEAD IMPRESSION: 1. 6 mm focus of diffusion abnormality with associated enhancement involving the left paramedian pontomedullary junction. Finding is nonspecific, and could reflect a small focus of acute demyelination. A focal subacute ischemic infarct would be the primary differential consideration. Either way, finding could produce patient's symptoms. 2. No other acute intracranial abnormality. 3. Empty sella. MRI ORBITS IMPRESSION: Normal MRI of the orbits. No evidence for acute optic neuritis or other abnormality. MRI CERVICAL IMPRESSION: 1. Question 2 small foci of cord signal abnormality within the right hemi cord at the level of C2-3 and C3-4 as above, suggestive for possible underlying demyelinating disease. No evidence for active demyelination. 2. Degenerative disc osteophyte at C5-6 with resultant moderate canal with severe right and moderate left C6 foraminal narrowing. 3. Right eccentric disc osteophyte at C3-4 with resultant mild canal with mild to moderate right C4 foraminal stenosis. 4. Mild right C5 foraminal stenosis related uncovertebral hypertrophy. Electronically Signed   By: Rise Mu M.D.   On: 08/27/2018 04:44   Mr Rockwell [place] WJ Contrast  Result Date: 08/27/2018 CLINICAL DATA:  Initial evaluation for worsening left arm numbness with blurry vision in left eye. History of multiple sclerosis. EXAM: MRI HEAD AND ORBITS WITHOUT AND WITH CONTRAST MRI CERVICAL SPINE WITHOUT AND WITH CONTRAST TECHNIQUE: Multiplanar, multiecho pulse sequences of the brain and surrounding structures were obtained without and with intravenous contrast. Multiplanar, multiecho pulse sequences of the orbits and surrounding structures were obtained including fat saturation techniques, before and after intravenous contrast administration. CONTRAST:  10 CC OF GADAVIST. COMPARISON:  None available. FINDINGS: MRI HEAD FINDINGS  Brain: Single 6 mm focus of diffusion abnormality  seen at the left paramedian aspect of the pontomedullary junction (series 5, image 43). Associated somewhat vague T2/FLAIR signal abnormality (series 11, image 8). Lesion demonstrates postcontrast enhancement (series 46, image 12). Finding is nonspecific, and could reflect a small focus of acute demyelination or possibly subacute infarct. Appearance of the brain is otherwise normal. Cerebral volume within normal limits. Few additional punctate T2/FLAIR hyperintensities noted within the supratentorial cerebral white matter, nonspecific. No other evidence for acute or subacute infarct. No encephalomalacia to suggest chronic cortical infarction. No foci of susceptibility artifact to suggest acute or chronic intracranial hemorrhage. No mass lesion, midline shift or mass effect. No hydrocephalus. No extra-axial fluid collection. Incidental note made of an empty sella. No other abnormal enhancement. Vascular: Major intracranial vascular flow voids maintained. Skull and upper cervical spine: Craniocervical junction normal. No focal marrow replacing lesion. Scalp soft tissues unremarkable. Other: No mastoid effusion.  Inner ear structures normal. MRI ORBITS FINDINGS Orbits: Globes are symmetric in size with normal appearance and morphology bilaterally. Optic nerves symmetric. No optic nerve edema or enhancement to suggest acute optic neuritis. Extra-ocular muscles within normal limits bilaterally. Intraconal and extraconal fat well-maintained. Lacrimal glands normal. Superior orbital veins symmetric and normal. No abnormality at the orbital apices. Visualized sinuses: Visualized paranasal sinuses are clear. Soft tissues: Visualized periorbital soft tissues unremarkable. MRI CERVICAL SPINE FINDINGS Alignment: Mild levoscoliosis with straightening of the normal cervical lordosis. No listhesis. Vertebra: Vertebral body heights maintained without evidence for acute or chronic  fracture. Bone marrow signal intensity within normal limits. No worrisome osseous lesions. No abnormal marrow edema. Cord: Subtle patchy T2 signal abnormality within the right aspect of the cord at the level of C2-3 (series 32, image 10). Additional subtle possible cord signal abnormality within the right hemi cord at C3-4 (series 32, image 17). No other cord signal abnormality. No abnormal cord expansion or enhancement to suggest active demyelination. Paraspinous soft tissues: Craniocervical junction normal. Paraspinous and prevertebral soft tissues within normal limits. Normal intravascular flow voids seen within the vertebral arteries bilaterally. Disc levels: C2-C3: Unremarkable. C3-C4: Right eccentric disc osteophyte complex with right greater than left uncovertebral hypertrophy. Flattening and indentation of the right ventral thecal sac with mild flattening of the right hemi cord. Mild spinal stenosis. Mild to moderate right C4 foraminal stenosis. C4-C5: Mild diffuse disc bulge with bilateral uncovertebral hypertrophy. No spinal stenosis. Mild right C5 foraminal narrowing. C5-C6: Diffuse disc bulge with right greater than left uncovertebral hypertrophy. Flattening and partial effacement of the ventral thecal sac with resultant moderate spinal stenosis. Mild flattening of the ventral cord. Severe right with moderate left C6 foraminal stenosis. C6-C7: Diffuse disc bulge, slightly asymmetric to the left. Bilateral uncovertebral hypertrophy. Flattening of the ventral CSF and resultant mild spinal stenosis without cord deformity. Foramina remain patent. C7-T1:  Unremarkable. Visualized upper thoracic spine demonstrates no significant finding. IMPRESSION: MRI HEAD IMPRESSION: 1. 6 mm focus of diffusion abnormality with associated enhancement involving the left paramedian pontomedullary junction. Finding is nonspecific, and could reflect a small focus of acute demyelination. A focal subacute ischemic infarct would be  the primary differential consideration. Either way, finding could produce patient's symptoms. 2. No other acute intracranial abnormality. 3. Empty sella. MRI ORBITS IMPRESSION: Normal MRI of the orbits. No evidence for acute optic neuritis or other abnormality. MRI CERVICAL IMPRESSION: 1. Question 2 small foci of cord signal abnormality within the right hemi cord at the level of C2-3 and C3-4 as above, suggestive for possible underlying demyelinating disease. No evidence for  active demyelination. 2. Degenerative disc osteophyte at C5-6 with resultant moderate canal with severe right and moderate left C6 foraminal narrowing. 3. Right eccentric disc osteophyte at C3-4 with resultant mild canal with mild to moderate right C4 foraminal stenosis. 4. Mild right C5 foraminal stenosis related uncovertebral hypertrophy. Electronically Signed   By: Rise Mu M.D.   On: 08/27/2018 04:44     CBC Recent Labs  Lab 08/26/18 2017 08/28/18 0618  WBC 8.1 16.2*  HGB 13.4 13.7  HCT 41.4 41.4  PLT 270 264  MCV 97.6 96.1  MCH 31.6 31.8  MCHC 32.4 33.1  RDW 12.7 12.4    Chemistries  Recent Labs  Lab 08/26/18 2017 08/28/18 0618  NA 142 138  K 3.6 3.6  CL 108 108  CO2 24 24  GLUCOSE 104* 136*  BUN 9 7  CREATININE 0.99 0.89  CALCIUM 9.0 9.1     Osvaldo Shipper M.D on 08/30/2018 at 12:50 PM  Pager---Go to [email or web address] - password [email or web address] for contact info  [facility] - Office  [phone]

## 2018-08-31 LAB — BASIC METABOLIC PANEL
Anion gap: 7 (ref 5–15)
BUN: 10 mg/dL (ref 6–20)
CO2: 26 mmol/L (ref 22–32)
Calcium: 8.6 mg/dL — ABNORMAL LOW (ref 8.9–10.3)
Chloride: 104 mmol/L (ref 98–111)
Creatinine, Ser: 0.96 mg/dL (ref 0.44–1.00)
GFR calc Af Amer: >60 mL/min (ref >60)
GLUCOSE: 113 mg/dL — AB (ref 70–99)
POTASSIUM: 3.5 mmol/L (ref 3.5–5.1)
Sodium: 137 mmol/L (ref 135–145)

## 2018-08-31 NOTE — Discharge Summary (Signed)
[facility]  Physician Discharge Summary   Patient ID: Jennifer Arias MRN: [id number] DOB/AGE: 1981-06-24 37 y.o.  Admit date: 08/26/2018 Discharge date: 08/31/2018  PCP: Patient, No Pcp Per  DISCHARGE DIAGNOSES:  Exacerbation of multiple sclerosis, resolved  RECOMMENDATIONS FOR OUTPATIENT FOLLOW UP: 1. Referral sent to Dr. Epimenio Foot with [facility] neurology for outpatient management of multiple sclerosis   DISCHARGE CONDITION: fair  Diet recommendation: As before  [facility] Weights   08/27/18 2114  Weight: 120.5 kg    INITIAL HISTORY: 37 year old female with past medical history relevant for relapsing multiple sclerosis as well as history of elevated blood pressure and obesity admitted on 08/27/18 with concerns of flareup of her MS.  Prior to her presentation she had diplopia and tingling numbness in her limbs for 3 days.  MRI of C-spine and MRI of brain suggested possible MS along C2-C3 and C3-C4, neurology recommended IV Solu-Medrol 1 g daily for 5 days.  Consultations:  Neurology    HOSPITAL COURSE:   MS exacerbation Patient was seen by neurology.  Patient underwent MRI brain, cervical spine and orbits.  See reports below.  IV Solu-Medrol was recommended for 5 days.  Patient's symptoms included visual impairment and right-sided numbness.  The symptoms started improving. he feels much better this morning.  Excited about going home.  Patient will need to follow-up with neurology as an outpatient.  She prefers somebody here locally.  She can follow-up with Dr. Epimenio Foot with [facility] neurology. Referral has been sent to the office.  Elevated BP This was in the setting of high-dose IV steroids.  Blood pressure showed improved as she comes off of it.  Will recommend rechecking the blood pressure in 1 week time.  No need for any antihypertensive prescriptions at this time.  Overall stable.  Okay for discharge home today.    PERTINENT LABS:  The results of significant  diagnostics from this hospitalization (including imaging, microbiology, ancillary and laboratory) are listed below for reference.      Labs: Basic Metabolic Panel: Recent Labs  Lab 08/26/18 2017 08/28/18 0618 08/31/18 0627  NA 142 138 137  K 3.6 3.6 3.5  CL 108 108 104  CO2 24 24 26   GLUCOSE 104* 136* 113*  BUN 9 7 10   CREATININE 0.99 0.89 0.96  CALCIUM 9.0 9.1 8.6*   CBC: Recent Labs  Lab 08/26/18 2017 08/28/18 0618  WBC 8.1 16.2*  HGB 13.4 13.7  HCT 41.4 41.4  MCV 97.6 96.1  PLT 270 264     IMAGING STUDIES Mr [name] And Wo Contrast  Result Date: 08/27/2018 CLINICAL DATA:  Initial evaluation for worsening left arm numbness with blurry vision in left eye. History of multiple sclerosis. EXAM: MRI HEAD AND ORBITS WITHOUT AND WITH CONTRAST MRI CERVICAL SPINE WITHOUT AND WITH CONTRAST TECHNIQUE: Multiplanar, multiecho pulse sequences of the brain and surrounding structures were obtained without and with intravenous contrast. Multiplanar, multiecho pulse sequences of the orbits and surrounding structures were obtained including fat saturation techniques, before and after intravenous contrast administration. CONTRAST:  10 CC OF GADAVIST. COMPARISON:  None available. FINDINGS: MRI HEAD FINDINGS Brain: Single 6 mm focus of diffusion abnormality seen at the left paramedian aspect of the pontomedullary junction (series 5, image 43). Associated somewhat vague T2/FLAIR signal abnormality (series 11, image 8). Lesion demonstrates postcontrast enhancement (series 46, image 12). Finding is nonspecific, and could reflect a small focus of acute demyelination or possibly subacute infarct. Appearance of the brain is otherwise normal. Cerebral volume within  normal limits. Few additional punctate T2/FLAIR hyperintensities noted within the supratentorial cerebral white matter, nonspecific. No other evidence for acute or subacute infarct. No encephalomalacia to suggest chronic cortical infarction. No  foci of susceptibility artifact to suggest acute or chronic intracranial hemorrhage. No mass lesion, midline shift or mass effect. No hydrocephalus. No extra-axial fluid collection. Incidental note made of an empty sella. No other abnormal enhancement. Vascular: Major intracranial vascular flow voids maintained. Skull and upper cervical spine: Craniocervical junction normal. No focal marrow replacing lesion. Scalp soft tissues unremarkable. Other: No mastoid effusion.  Inner ear structures normal. MRI ORBITS FINDINGS Orbits: Globes are symmetric in size with normal appearance and morphology bilaterally. Optic nerves symmetric. No optic nerve edema or enhancement to suggest acute optic neuritis. Extra-ocular muscles within normal limits bilaterally. Intraconal and extraconal fat well-maintained. Lacrimal glands normal. Superior orbital veins symmetric and normal. No abnormality at the orbital apices. Visualized sinuses: Visualized paranasal sinuses are clear. Soft tissues: Visualized periorbital soft tissues unremarkable. MRI CERVICAL SPINE FINDINGS Alignment: Mild levoscoliosis with straightening of the normal cervical lordosis. No listhesis. Vertebra: Vertebral body heights maintained without evidence for acute or chronic fracture. Bone marrow signal intensity within normal limits. No worrisome osseous lesions. No abnormal marrow edema. Cord: Subtle patchy T2 signal abnormality within the right aspect of the cord at the level of C2-3 (series 32, image 10). Additional subtle possible cord signal abnormality within the right hemi cord at C3-4 (series 32, image 17). No other cord signal abnormality. No abnormal cord expansion or enhancement to suggest active demyelination. Paraspinous soft tissues: Craniocervical junction normal. Paraspinous and prevertebral soft tissues within normal limits. Normal intravascular flow voids seen within the vertebral arteries bilaterally. Disc levels: C2-C3: Unremarkable. C3-C4: Right  eccentric disc osteophyte complex with right greater than left uncovertebral hypertrophy. Flattening and indentation of the right ventral thecal sac with mild flattening of the right hemi cord. Mild spinal stenosis. Mild to moderate right C4 foraminal stenosis. C4-C5: Mild diffuse disc bulge with bilateral uncovertebral hypertrophy. No spinal stenosis. Mild right C5 foraminal narrowing. C5-C6: Diffuse disc bulge with right greater than left uncovertebral hypertrophy. Flattening and partial effacement of the ventral thecal sac with resultant moderate spinal stenosis. Mild flattening of the ventral cord. Severe right with moderate left C6 foraminal stenosis. C6-C7: Diffuse disc bulge, slightly asymmetric to the left. Bilateral uncovertebral hypertrophy. Flattening of the ventral CSF and resultant mild spinal stenosis without cord deformity. Foramina remain patent. C7-T1:  Unremarkable. Visualized upper thoracic spine demonstrates no significant finding. IMPRESSION: MRI HEAD IMPRESSION: 1. 6 mm focus of diffusion abnormality with associated enhancement involving the left paramedian pontomedullary junction. Finding is nonspecific, and could reflect a small focus of acute demyelination. A focal subacute ischemic infarct would be the primary differential consideration. Either way, finding could produce patient's symptoms. 2. No other acute intracranial abnormality. 3. Empty sella. MRI ORBITS IMPRESSION: Normal MRI of the orbits. No evidence for acute optic neuritis or other abnormality. MRI CERVICAL IMPRESSION: 1. Question 2 small foci of cord signal abnormality within the right hemi cord at the level of C2-3 and C3-4 as above, suggestive for possible underlying demyelinating disease. No evidence for active demyelination. 2. Degenerative disc osteophyte at C5-6 with resultant moderate canal with severe right and moderate left C6 foraminal narrowing. 3. Right eccentric disc osteophyte at C3-4 with resultant mild canal with  mild to moderate right C4 foraminal stenosis. 4. Mild right C5 foraminal stenosis related uncovertebral hypertrophy. Electronically Signed   By: Rise Mu  M.D.   On: 08/27/2018 04:44   Mr Cervical Spine W Or Wo Contrast  Result Date: 08/27/2018 CLINICAL DATA:  Initial evaluation for worsening left arm numbness with blurry vision in left eye. History of multiple sclerosis. EXAM: MRI HEAD AND ORBITS WITHOUT AND WITH CONTRAST MRI CERVICAL SPINE WITHOUT AND WITH CONTRAST TECHNIQUE: Multiplanar, multiecho pulse sequences of the brain and surrounding structures were obtained without and with intravenous contrast. Multiplanar, multiecho pulse sequences of the orbits and surrounding structures were obtained including fat saturation techniques, before and after intravenous contrast administration. CONTRAST:  10 CC OF GADAVIST. COMPARISON:  None available. FINDINGS: MRI HEAD FINDINGS Brain: Single 6 mm focus of diffusion abnormality seen at the left paramedian aspect of the pontomedullary junction (series 5, image 43). Associated somewhat vague T2/FLAIR signal abnormality (series 11, image 8). Lesion demonstrates postcontrast enhancement (series 46, image 12). Finding is nonspecific, and could reflect a small focus of acute demyelination or possibly subacute infarct. Appearance of the brain is otherwise normal. Cerebral volume within normal limits. Few additional punctate T2/FLAIR hyperintensities noted within the supratentorial cerebral white matter, nonspecific. No other evidence for acute or subacute infarct. No encephalomalacia to suggest chronic cortical infarction. No foci of susceptibility artifact to suggest acute or chronic intracranial hemorrhage. No mass lesion, midline shift or mass effect. No hydrocephalus. No extra-axial fluid collection. Incidental note made of an empty sella. No other abnormal enhancement. Vascular: Major intracranial vascular flow voids maintained. Skull and upper cervical  spine: Craniocervical junction normal. No focal marrow replacing lesion. Scalp soft tissues unremarkable. Other: No mastoid effusion.  Inner ear structures normal. MRI ORBITS FINDINGS Orbits: Globes are symmetric in size with normal appearance and morphology bilaterally. Optic nerves symmetric. No optic nerve edema or enhancement to suggest acute optic neuritis. Extra-ocular muscles within normal limits bilaterally. Intraconal and extraconal fat well-maintained. Lacrimal glands normal. Superior orbital veins symmetric and normal. No abnormality at the orbital apices. Visualized sinuses: Visualized paranasal sinuses are clear. Soft tissues: Visualized periorbital soft tissues unremarkable. MRI CERVICAL SPINE FINDINGS Alignment: Mild levoscoliosis with straightening of the normal cervical lordosis. No listhesis. Vertebra: Vertebral body heights maintained without evidence for acute or chronic fracture. Bone marrow signal intensity within normal limits. No worrisome osseous lesions. No abnormal marrow edema. Cord: Subtle patchy T2 signal abnormality within the right aspect of the cord at the level of C2-3 (series 32, image 10). Additional subtle possible cord signal abnormality within the right hemi cord at C3-4 (series 32, image 17). No other cord signal abnormality. No abnormal cord expansion or enhancement to suggest active demyelination. Paraspinous soft tissues: Craniocervical junction normal. Paraspinous and prevertebral soft tissues within normal limits. Normal intravascular flow voids seen within the vertebral arteries bilaterally. Disc levels: C2-C3: Unremarkable. C3-C4: Right eccentric disc osteophyte complex with right greater than left uncovertebral hypertrophy. Flattening and indentation of the right ventral thecal sac with mild flattening of the right hemi cord. Mild spinal stenosis. Mild to moderate right C4 foraminal stenosis. C4-C5: Mild diffuse disc bulge with bilateral uncovertebral hypertrophy. No  spinal stenosis. Mild right C5 foraminal narrowing. C5-C6: Diffuse disc bulge with right greater than left uncovertebral hypertrophy. Flattening and partial effacement of the ventral thecal sac with resultant moderate spinal stenosis. Mild flattening of the ventral cord. Severe right with moderate left C6 foraminal stenosis. C6-C7: Diffuse disc bulge, slightly asymmetric to the left. Bilateral uncovertebral hypertrophy. Flattening of the ventral CSF and resultant mild spinal stenosis without cord deformity. Foramina remain patent. C7-T1:  Unremarkable. Visualized upper  thoracic spine demonstrates no significant finding. IMPRESSION: MRI HEAD IMPRESSION: 1. 6 mm focus of diffusion abnormality with associated enhancement involving the left paramedian pontomedullary junction. Finding is nonspecific, and could reflect a small focus of acute demyelination. A focal subacute ischemic infarct would be the primary differential consideration. Either way, finding could produce patient's symptoms. 2. No other acute intracranial abnormality. 3. Empty sella. MRI ORBITS IMPRESSION: Normal MRI of the orbits. No evidence for acute optic neuritis or other abnormality. MRI CERVICAL IMPRESSION: 1. Question 2 small foci of cord signal abnormality within the right hemi cord at the level of C2-3 and C3-4 as above, suggestive for possible underlying demyelinating disease. No evidence for active demyelination. 2. Degenerative disc osteophyte at C5-6 with resultant moderate canal with severe right and moderate left C6 foraminal narrowing. 3. Right eccentric disc osteophyte at C3-4 with resultant mild canal with mild to moderate right C4 foraminal stenosis. 4. Mild right C5 foraminal stenosis related uncovertebral hypertrophy. Electronically Signed   By: Rise Mu M.D.   On: 08/27/2018 04:44   Mr Rock[place] WU Contrast  Result Date: 08/27/2018 CLINICAL DATA:  Initial evaluation for worsening left arm numbness with blurry vision in  left eye. History of multiple sclerosis. EXAM: MRI HEAD AND ORBITS WITHOUT AND WITH CONTRAST MRI CERVICAL SPINE WITHOUT AND WITH CONTRAST TECHNIQUE: Multiplanar, multiecho pulse sequences of the brain and surrounding structures were obtained without and with intravenous contrast. Multiplanar, multiecho pulse sequences of the orbits and surrounding structures were obtained including fat saturation techniques, before and after intravenous contrast administration. CONTRAST:  10 CC OF GADAVIST. COMPARISON:  None available. FINDINGS: MRI HEAD FINDINGS Brain: Single 6 mm focus of diffusion abnormality seen at the left paramedian aspect of the pontomedullary junction (series 5, image 43). Associated somewhat vague T2/FLAIR signal abnormality (series 11, image 8). Lesion demonstrates postcontrast enhancement (series 46, image 12). Finding is nonspecific, and could reflect a small focus of acute demyelination or possibly subacute infarct. Appearance of the brain is otherwise normal. Cerebral volume within normal limits. Few additional punctate T2/FLAIR hyperintensities noted within the supratentorial cerebral white matter, nonspecific. No other evidence for acute or subacute infarct. No encephalomalacia to suggest chronic cortical infarction. No foci of susceptibility artifact to suggest acute or chronic intracranial hemorrhage. No mass lesion, midline shift or mass effect. No hydrocephalus. No extra-axial fluid collection. Incidental note made of an empty sella. No other abnormal enhancement. Vascular: Major intracranial vascular flow voids maintained. Skull and upper cervical spine: Craniocervical junction normal. No focal marrow replacing lesion. Scalp soft tissues unremarkable. Other: No mastoid effusion.  Inner ear structures normal. MRI ORBITS FINDINGS Orbits: Globes are symmetric in size with normal appearance and morphology bilaterally. Optic nerves symmetric. No optic nerve edema or enhancement to suggest acute  optic neuritis. Extra-ocular muscles within normal limits bilaterally. Intraconal and extraconal fat well-maintained. Lacrimal glands normal. Superior orbital veins symmetric and normal. No abnormality at the orbital apices. Visualized sinuses: Visualized paranasal sinuses are clear. Soft tissues: Visualized periorbital soft tissues unremarkable. MRI CERVICAL SPINE FINDINGS Alignment: Mild levoscoliosis with straightening of the normal cervical lordosis. No listhesis. Vertebra: Vertebral body heights maintained without evidence for acute or chronic fracture. Bone marrow signal intensity within normal limits. No worrisome osseous lesions. No abnormal marrow edema. Cord: Subtle patchy T2 signal abnormality within the right aspect of the cord at the level of C2-3 (series 32, image 10). Additional subtle possible cord signal abnormality within the right hemi cord at C3-4 (series 32, image 17).  No other cord signal abnormality. No abnormal cord expansion or enhancement to suggest active demyelination. Paraspinous soft tissues: Craniocervical junction normal. Paraspinous and prevertebral soft tissues within normal limits. Normal intravascular flow voids seen within the vertebral arteries bilaterally. Disc levels: C2-C3: Unremarkable. C3-C4: Right eccentric disc osteophyte complex with right greater than left uncovertebral hypertrophy. Flattening and indentation of the right ventral thecal sac with mild flattening of the right hemi cord. Mild spinal stenosis. Mild to moderate right C4 foraminal stenosis. C4-C5: Mild diffuse disc bulge with bilateral uncovertebral hypertrophy. No spinal stenosis. Mild right C5 foraminal narrowing. C5-C6: Diffuse disc bulge with right greater than left uncovertebral hypertrophy. Flattening and partial effacement of the ventral thecal sac with resultant moderate spinal stenosis. Mild flattening of the ventral cord. Severe right with moderate left C6 foraminal stenosis. C6-C7: Diffuse disc  bulge, slightly asymmetric to the left. Bilateral uncovertebral hypertrophy. Flattening of the ventral CSF and resultant mild spinal stenosis without cord deformity. Foramina remain patent. C7-T1:  Unremarkable. Visualized upper thoracic spine demonstrates no significant finding. IMPRESSION: MRI HEAD IMPRESSION: 1. 6 mm focus of diffusion abnormality with associated enhancement involving the left paramedian pontomedullary junction. Finding is nonspecific, and could reflect a small focus of acute demyelination. A focal subacute ischemic infarct would be the primary differential consideration. Either way, finding could produce patient's symptoms. 2. No other acute intracranial abnormality. 3. Empty sella. MRI ORBITS IMPRESSION: Normal MRI of the orbits. No evidence for acute optic neuritis or other abnormality. MRI CERVICAL IMPRESSION: 1. Question 2 small foci of cord signal abnormality within the right hemi cord at the level of C2-3 and C3-4 as above, suggestive for possible underlying demyelinating disease. No evidence for active demyelination. 2. Degenerative disc osteophyte at C5-6 with resultant moderate canal with severe right and moderate left C6 foraminal narrowing. 3. Right eccentric disc osteophyte at C3-4 with resultant mild canal with mild to moderate right C4 foraminal stenosis. 4. Mild right C5 foraminal stenosis related uncovertebral hypertrophy. Electronically Signed   By: Rise Mu M.D.   On: 08/27/2018 04:44    DISCHARGE EXAMINATION: Vitals:   08/30/18 1908 08/30/18 2308 08/31/18 0316 08/31/18 0829  BP: (!) 161/108 (!) 177/97 (!) 160/91 (!) 146/77  Pulse: 70 (!) 45 (!) 44 (!) 53  Resp:  18 18 16   Temp: 98.2 F (36.8 C) 97.7 F (36.5 C) 97.8 F (36.6 C) 98.1 F (36.7 C)  TempSrc: Oral Oral Oral Oral  SpO2:  100% 100% 94%  Weight:      Height:       General appearance: alert, cooperative, appears stated age and no distress Resp: clear to auscultation bilaterally Cardio:  regular rate and rhythm, S1, S2 normal, no murmur, click, rub or gallop GI: soft, non-tender; bowel sounds normal; no masses,  no organomegaly  DISPOSITION: Home  Discharge Instructions    Ambulatory referral to Neurology   Complete by:  As directed    An appointment is requested in approximately: 2 weeks. For Multiple sclerosis. Patient was admitted for MS exacerbation and treated with IV steroids.   Call MD for:  difficulty breathing, headache or visual disturbances   Complete by:  As directed    Call MD for:  extreme fatigue   Complete by:  As directed    Call MD for:  persistant dizziness or light-headedness   Complete by:  As directed    Call MD for:  persistant nausea and vomiting   Complete by:  As directed    Call MD  for:  severe uncontrolled pain   Complete by:  As directed    Call MD for:  temperature >100.4   Complete by:  As directed    Discharge instructions   Complete by:  As directed    Please establish with a primary care provider. You should have your blood pressure checked in a week. A referral has been sent to Dr. Bonnita Hollow office (Neurology). Please call their office in 3 days if you hear by them.  You were cared for by a hospitalist during your hospital stay. If you have any questions about your discharge medications or the care you received while you were in the hospital after you are discharged, you can call the unit and asked to speak with the hospitalist on call if the hospitalist that took care of you is not available. Once you are discharged, your primary care physician will handle any further medical issues. Please note that NO REFILLS for any discharge medications will be authorized once you are discharged, as it is imperative that you return to your primary care physician (or establish a relationship with a primary care physician if you do not have one) for your aftercare needs so that they can reassess your need for medications and monitor your lab values. If you  do not have a primary care physician, you can call [phone] for a physician referral.   Increase activity slowly   Complete by:  As directed         Allergies as of 08/31/2018      Reactions   Sulfa Antibiotics Hives, Itching, Rash      Medication List    You have not been prescribed any medications.      Follow-up Information    Sater, Pearletha Furl, MD Follow up.   Specialty:  Neurology Why:  referral has been sent to his office.  Contact information: [address] [place] [address] [phone]           TOTAL DISCHARGE TIME: 35 mins  Osvaldo Shipper  [facility] Pager [phone]  08/31/2018, 1:25 PM

## 2018-08-31 NOTE — Progress Notes (Signed)
Pt being discharged from hospital per orders from MD. Pt educated on discharge instructions. Pt verbalized understanding of instructions. All questions and concerns were addressed. Pt's IV was removed prior to discharge. Pt exited hospital via ambulation accompanied by staff. 

## 2018-08-31 NOTE — Discharge Instructions (Signed)
Multiple Sclerosis °Multiple sclerosis (MS) is a disease of the central nervous system. It leads to the loss of the insulating covering of the nerves (myelin sheath) of your brain. When this happens, brain signals do not get sent properly or may not get sent at all. The age of onset of MS varies. °What are the causes? °The cause of MS is unknown. However, it is more common in the [place] than in the southern [place]. °What increases the risk? °There is a higher number of women with MS than men. MS is not an illness that is passed down to you from your family members (inherited). However, your risk of MS is higher if you have a relative with MS. °What are the signs or symptoms? °The symptoms of MS occur in episodes or attacks. These attacks may last weeks to months. There may be long periods of almost no symptoms between attacks. The symptoms of MS vary. This is because of the many different ways it affects the central nervous system. The main symptoms of MS include: °· Vision problems and eye pain. °· Numbness. °· Weakness. °· Inability to move your arms, hands, feet, or legs (paralysis). °· Balance problems. °· Tremors. °How is this diagnosed? °Your health care provider can diagnose MS with the help of imaging exams and lab tests. These may include specialized X-ray exams and spinal fluid tests. The best imaging exam to confirm a diagnosis of MS is an MRI. °How is this treated? °There is no known cure for MS, but there are medicines that can decrease the number and frequency of attacks. Steroids are often used for short-term relief. Physical and occupational therapy may also help. There are also many new alternative or complementary treatments available to help control the symptoms of MS. Ask your health care provider if any of these other options are right for you. °Follow these instructions at home: °· Take medicines as directed by your health care provider. °· Exercise as directed by your  health care provider. °Contact a health care provider if: °You begin to feel depressed. °Get help right away if: °· You develop paralysis. °· You have problems with bladder, bowel, or sexual function. °· You develop mental changes, such as forgetfulness or mood swings. °· You have a period of uncontrolled movements (seizure). °This information is not intended to replace advice given to you by your health care provider. Make sure you discuss any questions you have with your health care provider. °Document Released: 11/16/2000 Document Revised: 04/26/2016 Document Reviewed: 07/27/2013 °Elsevier Interactive Patient Education © 2017 [employer] ° °

## 2018-12-15 DIAGNOSIS — G35 Multiple sclerosis: Secondary | ICD-10-CM | POA: Diagnosis not present

## 2018-12-17 DIAGNOSIS — Z0000 Encounter for general adult medical examination without abnormal findings: Secondary | ICD-10-CM | POA: Diagnosis not present

## 2018-12-17 DIAGNOSIS — Z01419 Encounter for gynecological examination (general) (routine) without abnormal findings: Secondary | ICD-10-CM | POA: Diagnosis not present

## 2018-12-17 DIAGNOSIS — E782 Mixed hyperlipidemia: Secondary | ICD-10-CM | POA: Diagnosis not present

## 2019-05-11 DIAGNOSIS — M25562 Pain in left knee: Secondary | ICD-10-CM | POA: Diagnosis not present

## 2019-05-11 DIAGNOSIS — M25462 Effusion, left knee: Secondary | ICD-10-CM | POA: Diagnosis not present

## 2019-05-28 DIAGNOSIS — M25561 Pain in right knee: Secondary | ICD-10-CM | POA: Diagnosis not present

## 2019-05-28 DIAGNOSIS — M25562 Pain in left knee: Secondary | ICD-10-CM | POA: Diagnosis not present

## 2019-06-15 DIAGNOSIS — Z0110 Encounter for examination of ears and hearing without abnormal findings: Secondary | ICD-10-CM | POA: Diagnosis not present

## 2019-06-15 DIAGNOSIS — H93293 Other abnormal auditory perceptions, bilateral: Secondary | ICD-10-CM | POA: Diagnosis not present

## 2019-06-15 DIAGNOSIS — J343 Hypertrophy of nasal turbinates: Secondary | ICD-10-CM | POA: Diagnosis not present

## 2019-07-06 DIAGNOSIS — M25561 Pain in right knee: Secondary | ICD-10-CM | POA: Diagnosis not present

## 2019-07-06 DIAGNOSIS — M25562 Pain in left knee: Secondary | ICD-10-CM | POA: Diagnosis not present

## 2019-07-08 DIAGNOSIS — G35 Multiple sclerosis: Secondary | ICD-10-CM | POA: Diagnosis not present

## 2019-10-20 DIAGNOSIS — G43809 Other migraine, not intractable, without status migrainosus: Secondary | ICD-10-CM | POA: Diagnosis not present

## 2019-10-20 DIAGNOSIS — R42 Dizziness and giddiness: Secondary | ICD-10-CM | POA: Diagnosis not present

## 2019-10-20 DIAGNOSIS — G35 Multiple sclerosis: Secondary | ICD-10-CM | POA: Diagnosis not present

## 2019-10-20 DIAGNOSIS — R200 Anesthesia of skin: Secondary | ICD-10-CM | POA: Diagnosis not present

## 2019-10-20 DIAGNOSIS — M5126 Other intervertebral disc displacement, lumbar region: Secondary | ICD-10-CM | POA: Diagnosis not present

## 2019-10-20 DIAGNOSIS — G939 Disorder of brain, unspecified: Secondary | ICD-10-CM | POA: Diagnosis not present

## 2019-12-07 ENCOUNTER — Other Ambulatory Visit: Payer: Self-pay

## 2019-12-07 ENCOUNTER — Encounter (HOSPITAL_COMMUNITY): Payer: Self-pay | Admitting: *Deleted

## 2019-12-07 ENCOUNTER — Emergency Department (HOSPITAL_COMMUNITY)
Admission: EM | Admit: 2019-12-07 | Discharge: 2019-12-08 | Discharge status: Against Medical Advice | Payer: Medicaid Other | Attending: Emergency Medicine | Admitting: Emergency Medicine

## 2019-12-07 DIAGNOSIS — Z5321 Procedure and treatment not carried out due to patient leaving prior to being seen by health care provider: Secondary | ICD-10-CM | POA: Diagnosis not present

## 2019-12-07 DIAGNOSIS — M549 Dorsalgia, unspecified: Secondary | ICD-10-CM | POA: Diagnosis present

## 2019-12-07 MED ORDER — IBUPROFEN 400 MG PO TABS
400.0000 mg | ORAL_TABLET | Freq: Once | ORAL | Status: AC | PRN
  Administered 2019-12-07: 400 mg via ORAL
  Filled 2019-12-07: qty 1

## 2019-12-07 NOTE — ED Triage Notes (Signed)
Pt has hx of MS and is having lower back pain, which is not her typical pain.

## 2019-12-07 NOTE — ED Notes (Addendum)
Pt leaving AMA. Pt stated she is more comfortable at home. Pt will return if symptoms worsen.

## 2019-12-09 DIAGNOSIS — M545 Low back pain: Secondary | ICD-10-CM | POA: Diagnosis not present

## 2020-02-10 DIAGNOSIS — R600 Localized edema: Secondary | ICD-10-CM | POA: Diagnosis not present

## 2020-02-10 DIAGNOSIS — I10 Essential (primary) hypertension: Secondary | ICD-10-CM | POA: Diagnosis not present

## 2020-04-21 ENCOUNTER — Ambulatory Visit: Payer: Medicaid Other

## 2020-07-26 ENCOUNTER — Emergency Department (HOSPITAL_COMMUNITY): Payer: Medicaid Other

## 2020-07-26 ENCOUNTER — Other Ambulatory Visit: Payer: Self-pay

## 2020-07-26 ENCOUNTER — Encounter (HOSPITAL_COMMUNITY): Payer: Self-pay | Admitting: Emergency Medicine

## 2020-07-26 ENCOUNTER — Emergency Department (HOSPITAL_COMMUNITY)
Admission: EM | Admit: 2020-07-26 | Discharge: 2020-07-27 | Disposition: A | Discharge status: Home / Self Care | Payer: Medicaid Other | Attending: Emergency Medicine | Admitting: Emergency Medicine

## 2020-07-26 DIAGNOSIS — Z79899 Other long term (current) drug therapy: Secondary | ICD-10-CM | POA: Insufficient documentation

## 2020-07-26 DIAGNOSIS — G9389 Other specified disorders of brain: Secondary | ICD-10-CM | POA: Diagnosis not present

## 2020-07-26 DIAGNOSIS — G35 Multiple sclerosis: Secondary | ICD-10-CM | POA: Insufficient documentation

## 2020-07-26 DIAGNOSIS — R202 Paresthesia of skin: Secondary | ICD-10-CM | POA: Diagnosis present

## 2020-07-26 DIAGNOSIS — R600 Localized edema: Secondary | ICD-10-CM | POA: Diagnosis not present

## 2020-07-26 DIAGNOSIS — G379 Demyelinating disease of central nervous system, unspecified: Secondary | ICD-10-CM | POA: Diagnosis not present

## 2020-07-26 DIAGNOSIS — I10 Essential (primary) hypertension: Secondary | ICD-10-CM | POA: Diagnosis not present

## 2020-07-26 DIAGNOSIS — M4802 Spinal stenosis, cervical region: Secondary | ICD-10-CM | POA: Diagnosis not present

## 2020-07-26 DIAGNOSIS — M5023 Other cervical disc displacement, cervicothoracic region: Secondary | ICD-10-CM | POA: Diagnosis not present

## 2020-07-26 DIAGNOSIS — M50223 Other cervical disc displacement at C6-C7 level: Secondary | ICD-10-CM | POA: Diagnosis not present

## 2020-07-26 DIAGNOSIS — R200 Anesthesia of skin: Secondary | ICD-10-CM | POA: Diagnosis not present

## 2020-07-26 DIAGNOSIS — M79602 Pain in left arm: Secondary | ICD-10-CM | POA: Insufficient documentation

## 2020-07-26 LAB — CBC
HCT: 39.1 % (ref 36.0–46.0)
Hemoglobin: 12.4 g/dL (ref 12.0–15.0)
MCH: 30.2 pg (ref 26.0–34.0)
MCHC: 31.7 g/dL (ref 30.0–36.0)
MCV: 95.4 fL (ref 80.0–100.0)
Platelets: 305 10*3/uL (ref 150–400)
RBC: 4.1 MIL/uL (ref 3.87–5.11)
RDW: 12.6 % (ref 11.5–15.5)
WBC: 7.5 10*3/uL (ref 4.0–10.5)
nRBC: 0 % (ref 0.0–0.2)

## 2020-07-26 LAB — DIFFERENTIAL
Abs Immature Granulocytes: 0.02 10*3/uL (ref 0.00–0.07)
Basophils Absolute: 0 10*3/uL (ref 0.0–0.1)
Basophils Relative: 1 %
Eosinophils Absolute: 0.1 10*3/uL (ref 0.0–0.5)
Eosinophils Relative: 1 %
Immature Granulocytes: 0 %
Lymphocytes Relative: 36 %
Lymphs Abs: 2.7 10*3/uL (ref 0.7–4.0)
Monocytes Absolute: 0.7 10*3/uL (ref 0.1–1.0)
Monocytes Relative: 10 %
Neutro Abs: 3.9 10*3/uL (ref 1.7–7.7)
Neutrophils Relative %: 52 %

## 2020-07-26 LAB — COMPREHENSIVE METABOLIC PANEL
ALT: 16 U/L (ref 0–44)
AST: 18 U/L (ref 15–41)
Albumin: 3.6 g/dL (ref 3.5–5.0)
Alkaline Phosphatase: 57 U/L (ref 38–126)
Anion gap: 10 (ref 5–15)
BUN: 10 mg/dL (ref 6–20)
CO2: 23 mmol/L (ref 22–32)
Calcium: 9.1 mg/dL (ref 8.9–10.3)
Chloride: 107 mmol/L (ref 98–111)
Creatinine, Ser: 0.96 mg/dL (ref 0.44–1.00)
GFR calc Af Amer: >60 mL/min (ref >60)
GFR calc non Af Amer: >60 mL/min (ref >60)
Glucose, Bld: 98 mg/dL (ref 70–99)
Potassium: 3.8 mmol/L (ref 3.5–5.1)
Sodium: 140 mmol/L (ref 135–145)
Total Bilirubin: 0.4 mg/dL (ref 0.3–1.2)
Total Protein: 6.2 g/dL — ABNORMAL LOW (ref 6.5–8.1)

## 2020-07-26 LAB — APTT: aPTT: 25 seconds (ref 24–36)

## 2020-07-26 LAB — PROTIME-INR
INR: 0.9 (ref 0.8–1.2)
Prothrombin Time: 12.2 seconds (ref 11.4–15.2)

## 2020-07-26 LAB — I-STAT CHEM 8, ED
BUN: 11 mg/dL (ref 6–20)
Calcium, Ion: 1.18 mmol/L (ref 1.15–1.40)
Chloride: 105 mmol/L (ref 98–111)
Creatinine, Ser: 1 mg/dL (ref 0.44–1.00)
Glucose, Bld: 90 mg/dL (ref 70–99)
HCT: 38 % (ref 36.0–46.0)
Hemoglobin: 12.9 g/dL (ref 12.0–15.0)
Potassium: 3.8 mmol/L (ref 3.5–5.1)
Sodium: 142 mmol/L (ref 135–145)
TCO2: 25 mmol/L (ref 22–32)

## 2020-07-26 LAB — I-STAT BETA HCG BLOOD, ED (MC, WL, AP ONLY): I-stat hCG, quantitative: <5 m[IU]/mL (ref <5)

## 2020-07-26 MED ORDER — SODIUM CHLORIDE 0.9% FLUSH: 3.0000 mL | Freq: Once | INTRAVENOUS | Status: DC

## 2020-07-26 NOTE — ED Triage Notes (Signed)
Patient presents with multiple complaints. Reports L arm pain, numbness, and weakness X3 days. Also reports generalized weakness, heaviness, and swelling to all extremities. History of MS.  Equal grips present but does report dec sensation to L arm

## 2020-07-27 ENCOUNTER — Emergency Department (HOSPITAL_COMMUNITY): Payer: Medicaid Other

## 2020-07-27 DIAGNOSIS — G379 Demyelinating disease of central nervous system, unspecified: Secondary | ICD-10-CM | POA: Diagnosis not present

## 2020-07-27 DIAGNOSIS — R600 Localized edema: Secondary | ICD-10-CM | POA: Diagnosis not present

## 2020-07-27 DIAGNOSIS — M5023 Other cervical disc displacement, cervicothoracic region: Secondary | ICD-10-CM | POA: Diagnosis not present

## 2020-07-27 DIAGNOSIS — M79602 Pain in left arm: Secondary | ICD-10-CM | POA: Diagnosis not present

## 2020-07-27 DIAGNOSIS — M4802 Spinal stenosis, cervical region: Secondary | ICD-10-CM | POA: Diagnosis not present

## 2020-07-27 DIAGNOSIS — G35 Multiple sclerosis: Secondary | ICD-10-CM | POA: Diagnosis not present

## 2020-07-27 DIAGNOSIS — M50223 Other cervical disc displacement at C6-C7 level: Secondary | ICD-10-CM | POA: Diagnosis not present

## 2020-07-27 DIAGNOSIS — G9389 Other specified disorders of brain: Secondary | ICD-10-CM | POA: Diagnosis not present

## 2020-07-27 MED ORDER — GADOBUTROL 1 MMOL/ML IV SOLN
10.0000 mL | Freq: Once | INTRAVENOUS | Status: AC | PRN
  Administered 2020-07-27: 10 mL via INTRAVENOUS

## 2020-07-27 MED ORDER — LORAZEPAM 2 MG/ML IJ SOLN
1.0000 mg | Freq: Once | INTRAMUSCULAR | Status: AC
  Administered 2020-07-27: 1 mg via INTRAVENOUS
  Filled 2020-07-27: qty 1

## 2020-07-27 NOTE — Discharge Instructions (Addendum)
Your labwork and imaging were reassuring today. Please follow up with [facility] to establish care for a new neurologist in the area.   Please also follow up with [facility] for primary care needs. I would recommend restarting your chlorthalidone as your blood pressure is elevated today (likely due to being off for the past few days). Your leg swelling is likely related to this as well.   I would recommend Ibuprofen and Tylenol as needed for your arm pain. You can take 600 mg Ibuprofen every 6-8 hours as needed and 1,000 mg Tylenol every 8 hours as needed.   Return to the ED IMMEDIATELY for any worsening symptoms including worsening pain, swelling to your arm, chest pain, shortness of breath, new one sided weakness, facial droop, speech changes, severe headache, vision changes, or any other new/concerning symptoms

## 2020-07-27 NOTE — ED Provider Notes (Signed)
[facility] EMERGENCY DEPARTMENT Provider Note   CSN: [id number] Arrival date & time: 07/26/20  2027     History Chief Complaint  Patient presents with  . Weakness    Jennifer Arias is a 39 y.o. female with PMHx HTN, depression, anxiety, and MS on Ocrevus infusions who presents to the ED today with multiple complaints.   Pt is currently complaining of gradual onset, constant, sharp, L forearm pain that began approximately 5 days ago. Pt also complains of a tingling sensation to all 5 fingertips as well as decreased sensation in her entire LUE. She reports that the pain is masking all of her neuro symptoms at this time however they are still present and new. She reports the last time this happened she was having an MS flare approximately 2 years ago. She also complains of decreased sensation to her left face that began about 3 days ago. She does mention she is due for her ocrevus injection sometime this week. Pt denies headache, neck pain, fevers, chills.   Pt mentions stopping her chlorthalidone 25 mg about 3 days ago due to experiencing bilateral leg cramping. She states that the leg cramping has resolved however she now has worsening swelling to her BLEs. Pt also reports her blood pressure has been elevated; she has not followed up with her PCP regarding this. Pt reports having some blurry vision however she is unsure if this is related to needing a new glasses prescription. No chest pain or SOB. NO hx of CHF.   The history is provided by the patient and medical records.       Past Medical History:  Diagnosis Date  . Anemia    "in my younger years; improved after hysterectomy"   (08/27/2018)  . Anxiety   . Depression   . Essential hypertension   . MS (multiple sclerosis) (HCC)   . Obesity     Patient Active Problem List   Diagnosis Date Noted  . Multiple sclerosis exacerbation (HCC) 08/27/2018  . Essential hypertension 08/27/2018    Past Surgical History:    Procedure Laterality Date  . VAGINAL HYSTERECTOMY       OB History   No obstetric history on file.     Family History  Problem Relation Age of Onset  . Multiple sclerosis Father     Social History   Tobacco Use  . Smoking status: Never Smoker  . Smokeless tobacco: Never Used  Vaping Use  . Vaping Use: Never used  Substance Use Topics  . Alcohol use: Yes    Alcohol/week: 2.0 standard drinks    Types: 2 Standard drinks or equivalent per week  . Drug use: Yes    Types: Marijuana    Comment: 08/27/2018 "qd"    Home Medications Prior to Admission medications   Medication Sig Start Date End Date Taking? Authorizing Provider  chlorthalidone (HYGROTON) 25 MG tablet Take 25 mg by mouth daily. 05/26/20  Yes [provider]  FLUoxetine (PROZAC) 40 MG capsule Take 40 mg by mouth at bedtime. 05/26/20  Yes [provider]  fluticasone (FLONASE) 50 MCG/ACT nasal spray Place 1-2 sprays into both nostrils daily as needed for allergies or rhinitis.   Yes [provider]    Allergies    Sulfa antibiotics  Review of Systems   Review of Systems  Constitutional: Negative for chills and fever.  Eyes: Positive for visual disturbance.  Musculoskeletal: Positive for arthralgias. Negative for neck pain and neck stiffness.  Skin:  Negative for rash.  Neurological: Positive for numbness. Negative for headaches.  All other systems reviewed and are negative.   Physical Exam Updated Vital Signs BP (!) 148/106 (BP Location: Left Arm)   Pulse 69   Temp 98 F (36.7 C) (Oral)   Resp 16   Ht  (1.727 m)   Wt 136.1 kg   SpO2 100%   BMI 45.61 kg/m   Physical Exam Vitals and nursing note reviewed.  Constitutional:      Appearance: She is obese. She is not ill-appearing or diaphoretic.  HENT:     Head: Normocephalic and atraumatic.  Eyes:     Extraocular Movements: Extraocular movements intact.     Conjunctiva/sclera: Conjunctivae normal.     Pupils:  Pupils are equal, round, and reactive to light.     Comments: Visual Acuity Bilateral Distance:10/20 R Distance:10/25 L Distance:10/25  Cardiovascular:     Rate and Rhythm: Normal rate and regular rhythm.     Pulses: Normal pulses.  Pulmonary:     Effort: Pulmonary effort is normal.     Breath sounds: Normal breath sounds. No wheezing, rhonchi or rales.  Abdominal:     Palpations: Abdomen is soft.     Tenderness: There is no abdominal tenderness.  Musculoskeletal:     Cervical back: Neck supple.     Comments: Nonpitting edema bilaterally  + Diffuse left forearm TTP; No tenderness to the joints of LUE; skin warm and dry; no erythema or edema; 2+ distal pulses; ROM intact throughout.   Skin:    General: Skin is warm and dry.  Neurological:     Mental Status: She is alert.     Comments: Subjective decreased sensation to left face as well as LUE. No LLE involvement. Remainder of CN intact.  A&O x4 GCS 15 Strength 5/5 to BUE and BLEs Gait nonataxic including with tandem walking Coordination with finger-to-nose WNL Neg romberg, neg pronator drift     ED Results / Procedures / Treatments   Labs (all labs ordered are listed, but only abnormal results are displayed) Labs Reviewed  COMPREHENSIVE METABOLIC PANEL - Abnormal; Notable for the following components:      Result Value   Total Protein 6.2 (*)    All other components within normal limits  PROTIME-INR  APTT  CBC  DIFFERENTIAL  I-STAT CHEM 8, ED  I-STAT BETA HCG BLOOD, ED ([facility], [facility], [facility] ONLY)    EKG EKG Interpretation  Date/Time:  Tuesday July 26 2020 21:37:04 EDT Ventricular Rate:  77 PR Interval:  148 QRS Duration: 76 QT Interval:  402 QTC Calculation: 454 R Axis:   74 Text Interpretation: Normal sinus rhythm Nonspecific T wave abnormality Abnormal ECG No old tracing to compare Confirmed by Dione Booze ([id number]) on 07/27/2020 12:00:51 AM Also confirmed by Dione Booze ([id number]), editor Elita Quick ([id number])   on 07/27/2020 10:39:05 AM   Radiology CT HEAD WO CONTRAST  Result Date: 07/26/2020 CLINICAL DATA:  Left hand and finger numbness with left arm pain, initial encounter EXAM: CT HEAD WITHOUT CONTRAST TECHNIQUE: Contiguous axial images were obtained from the base of the skull through the vertex without intravenous contrast. COMPARISON:  08/27/2018 FINDINGS: Brain: No evidence of acute infarction, hemorrhage, hydrocephalus, extra-axial collection or mass lesion/mass effect. Vascular: No hyperdense vessel or unexpected calcification. Skull: Normal. Negative for fracture or focal lesion. Sinuses/Orbits: No acute finding. Other: None. IMPRESSION: No acute intracranial abnormality noted. Electronically Signed   By: Alcide Clever M.D.   On: 07/26/2020  22:19   MR Brain W and Wo Contrast  Result Date: 07/27/2020 CLINICAL DATA:  Multiple sclerosis. New onset left arm pain, numbness and weakness over the last 3 days. EXAM: MRI HEAD WITHOUT AND WITH CONTRAST TECHNIQUE: Multiplanar, multiecho pulse sequences of the brain and surrounding structures were obtained without and with intravenous contrast. CONTRAST:  55mL GADAVIST GADOBUTROL 1 MMOL/ML IV SOLN COMPARISON:  Head CT yesterday.  MRI 08/27/2018 FINDINGS: Brain: Diffusion imaging does not show any acute or subacute infarction or other cause of restricted diffusion. There has been resolution of the focus of abnormal enhancing T1 signal and T2 and FLAIR bright signal at the left pontomedullary junction. No residual sequela is visible. Elsewhere, there is a chronic focus of T2 and FLAIR signal adjacent to the occipital horn of the left lateral ventricle that is unchanged. A few punctate foci of T2 and FLAIR signal in the left frontal white matter are unchanged. No abnormality seen affecting the right hemisphere. No evidence of mass lesion, hemorrhage, hydrocephalus or extra-axial collection. Vascular: Major vessels at the base of the brain show flow. Skull and upper  cervical spine: Negative Sinuses/Orbits: Clear/normal Other: None IMPRESSION: 1. Resolution of the focus of abnormal enhancing T1 signal and T2 and FLAIR bright signal at the left pontomedullary junction. No visible residual finding. This favors a previous focus of demyelination over infarction. 2. No new or progressive finding. Chronic abnormal T2 and FLAIR signal in the white matter adjacent to the occipital horn of the left lateral ventricle. Few punctate foci of T2 and FLAIR signal in the left cerebral hemispheric white matter are unchanged. Electronically Signed   By: Paulina Fusi M.D.   On: 07/27/2020 13:41   MR Cervical Spine W or Wo Contrast  Result Date: 07/27/2020 CLINICAL DATA:  Multiple sclerosis. Left arm pain, numbness and weakness over the last 3 days. EXAM: MRI CERVICAL SPINE WITHOUT AND WITH CONTRAST TECHNIQUE: Multiplanar and multiecho pulse sequences of the cervical spine, to include the craniocervical junction and cervicothoracic junction, were obtained without and with intravenous contrast. CONTRAST:  13mL GADAVIST GADOBUTROL 1 MMOL/ML IV SOLN COMPARISON:  08/27/2018 FINDINGS: Alignment: Straightening of the normal cervical lordosis. Vertebrae: Discogenic endplate edema at the C5-6 level. Cord: No primary cord lesion. No evidence of cord demyelinating disease. No abnormality seen at C2-3 as was questioned previously. See below regarding stenosis. Posterior Fossa, vertebral arteries, paraspinal tissues: Negative Disc levels: Foramen magnum is widely patent. No significant finding at C1-2 or C2-3. C3-4: Central to right posterolateral disc herniation. Effacement of the ventral subarachnoid space with AP diameter in the midline measuring 7.3 mm. Slight indentation of the ventral cord on the right. Mild to moderate right foraminal narrowing that could affect the right C4 nerve. C4-5: Normal interspace. C5-6: Right posterolateral to foraminal disc herniation with right foraminal stenosis likely  to compress the right C6 nerve. Narrowing of the ventral subarachnoid space with AP diameter in the midline measuring 9 mm. Mild uncovertebral prominence on the left but without significant foraminal narrowing. Discogenic endplate edema as noted above, which could be correlated with regional neck pain. C6-7: Bulging of the disc and mild uncovertebral prominence. No likely neural compression. C7-T1: Normal Compared to the previous study, the disc herniation at C3-4 is larger. The discogenic edematous change at the C5-6 level is newly seen. At C6-7, there is slight involution of a previous left-sided predominant disc bulge. IMPRESSION: 1. No evidence of cord demyelinating disease. 2. C3-4: Central to right posterolateral disc herniation is  larger. Effacement of the ventral subarachnoid space on the right. Slight indentation of the ventral cord on the right. Mild to moderate right foraminal narrowing that could affect the right C4 nerve. 3. C5-6: Right posterolateral to foraminal disc herniation with right foraminal stenosis likely to compress the right C6 nerve. Discogenic endplate edema at this level could be correlated with regional neck pain. Stenosis similar to the study of 2019. The discogenic edema is new since the prior study. 4. C6-7: Slight involution of a previous left-sided predominant disc bulge. No compressive stenosis. Electronically Signed   By: Paulina Fusi M.D.   On: 07/27/2020 13:50    Procedures Procedures (including critical care time)  Medications Ordered in ED Medications  sodium chloride flush (NS) 0.9 % injection 3 mL (has no administration in time range)  LORazepam (ATIVAN) injection 1 mg (1 mg Intravenous Given 07/27/20 1142)  gadobutrol (GADAVIST) 1 MMOL/ML injection 10 mL (10 mLs Intravenous Contrast Given 07/27/20 1331)    ED Course  I have reviewed the triage vital signs and the nursing notes.  Pertinent labs & imaging results that were available during my care of the  patient were reviewed by me and considered in my medical decision making (see chart for details).    MDM Rules/Calculators/A&P                          39 year old female with hx of MS (on Ocrevus/due for injection this week however recently moved to the area from [place] and is not established with neuro here) with complaint of new left sided face/LUE numbness/decreased sensation as well as left forearm pain and BLE swelling (recenlty went off of her chlorthalidone) for the past 3-5 days. Questionable vision changes however pt reports she is supposed to wear glasses and her Rx is old. On arrival to the ED pt is afebrile, nontachycardic, and nontachypneic. Appears to be in NAD. Blood pressure elevated today 145/85. On exam pt has subjective decreased sensation to left side of face and LUE; remainder of neuro exam unremarkable. NIH score of 1. Symptoms began 3 days ago. CT Head while pt was in the waiting room unremarkable and labs reassuring. Discussed case with neurologist Dr. Otelia Limes who recommends an MRI with contrast to rule out MS exacerbation; will order brain and C spine given pt's left forearm pain/question is pt is experiencing radiculopathy however would not explain facial involvement. Pt without any complaints of neck pain at this time. No meningeal signs. No trauma to the arm and no signs of swelling to suggest DVT. Do not feel she needs imaging at this time. She is also noted to have non pitting edema bilaterally to lower extremities; suspect from stopping chlorthalidone in the setting of elevated BP. No complaints of chest pain or SOB. Satting 100% on RA. LCTAB. No hx of CHF. Pt will need to establish care with PCP to discuss medication management; appears she was on HCTZ however was not helping with her leg swelling so she was switched to chlorthalidone in March 2021.   MRI brain without acute findings with resolution of previous enhancement seen on MRI in 2019. MRI C spine without any  findings to account for pt's LEFT arm pain. Pt will need to follow up with her neurologist but no evidence of acute MS flare. Discussed with attending physician Dr. Clarene Duke who does not believe pt requires steroids at this time. Will plan to discharge home with [facility] follow  up to establish care as well as [facility] for primary care needs. Pt instructed to start her chlorthalidone again as prescribed. She is in agreement with plan and stable for discharge home.  This note was prepared using Dragon voice recognition software and may include unintentional dictation errors due to the inherent limitations of voice recognition software.  Final Clinical Impression(s) / ED Diagnoses Final diagnoses:  Left arm pain  Essential hypertension  Multiple sclerosis (HCC)    Rx / DC Orders ED Discharge Orders    None       Discharge Instructions     Your labwork and imaging were reassuring today. Please follow up with [facility] to establish care for a new neurologist in the area.   Please also follow up with [facility] for primary care needs. I would recommend restarting your chlorthalidone as your blood pressure is elevated today (likely due to being off for the past few days). Your leg swelling is likely related to this as well.   I would recommend Ibuprofen and Tylenol as needed for your arm pain. You can take 600 mg Ibuprofen every 6-8 hours as needed and 1,000 mg Tylenol every 8 hours as needed.   Return to the ED IMMEDIATELY for any worsening symptoms including worsening pain, swelling to your arm, chest pain, shortness of breath, new one sided weakness, facial droop, speech changes, severe headache, vision changes, or any other new/concerning symptoms        Tanda Rockers, PA-C 07/27/20 1417    Little, Ambrose Finland, MD 07/27/20 1526

## 2020-07-27 NOTE — ED Notes (Addendum)
Patient verbalizes understanding of discharge instructions. Opportunity for questioning and answers were provided. Armband removed by staff, pt discharged from ED to home. Ambulates with ease to restroom to change back into personal clothes

## 2020-07-27 NOTE — ED Notes (Signed)
Patient refused vital signs 

## 2020-07-28 DIAGNOSIS — Z0000 Encounter for general adult medical examination without abnormal findings: Secondary | ICD-10-CM | POA: Diagnosis not present

## 2020-07-29 ENCOUNTER — Other Ambulatory Visit: Payer: Self-pay | Admitting: Physician Assistant

## 2020-07-29 DIAGNOSIS — Z1231 Encounter for screening mammogram for malignant neoplasm of breast: Secondary | ICD-10-CM

## 2020-08-10 ENCOUNTER — Other Ambulatory Visit: Payer: Self-pay

## 2020-08-10 ENCOUNTER — Ambulatory Visit: Payer: Medicaid Other | Admitting: Neurology

## 2020-08-10 ENCOUNTER — Telehealth: Payer: Self-pay | Admitting: *Deleted

## 2020-08-10 ENCOUNTER — Encounter: Payer: Self-pay | Admitting: Neurology

## 2020-08-10 VITALS — BP 148/91 | HR 74 | Ht 68.0 in | Wt 299.0 lb

## 2020-08-10 DIAGNOSIS — Z79899 Other long term (current) drug therapy: Secondary | ICD-10-CM | POA: Diagnosis not present

## 2020-08-10 DIAGNOSIS — R531 Weakness: Secondary | ICD-10-CM

## 2020-08-10 DIAGNOSIS — M62838 Other muscle spasm: Secondary | ICD-10-CM

## 2020-08-10 DIAGNOSIS — R200 Anesthesia of skin: Secondary | ICD-10-CM | POA: Diagnosis not present

## 2020-08-10 DIAGNOSIS — Z5181 Encounter for therapeutic drug level monitoring: Secondary | ICD-10-CM

## 2020-08-10 DIAGNOSIS — G35 Multiple sclerosis: Secondary | ICD-10-CM

## 2020-08-10 MED ORDER — MELOXICAM 15 MG PO TABS: 15.0000 mg | ORAL_TABLET | Freq: Every day | ORAL | 5 refills | Status: DC

## 2020-08-10 MED ORDER — MELOXICAM 15 MG PO TBDP: ORAL_TABLET | ORAL | 5 refills | Status: DC

## 2020-08-10 MED ORDER — BACLOFEN 10 MG PO TABS: ORAL_TABLET | ORAL | 5 refills | Status: DC

## 2020-08-10 NOTE — Progress Notes (Signed)
[facility]  PATIENT: GHIANNA DEVOL DOB: September 14, 1981  REFERRING DOCTOR OR PCP:  Dr. Julio Sicks SOURCE: Patient, notes from primary care, imaging and lab reports, MRI images personally reviewed.  _________________________________   HISTORICAL  CHIEF COMPLAINT:  Chief Complaint  Patient presents with  . New Patient (Initial Visit)    RM 13, alone. Paper referral from Norva Riffle, PA for MS management. Dx with MS about 9 years ago. Dx around 2012. Not on DMT. Last med she was on is Ocrevus. Due for an infusion.  Having left arm pain/weakness. Having left leg weakness. This started in the last couple weeks. She went to ER for this. Told her it was not a flare up. 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     HISTORY OF PRESENT ILLNESS:  I had the pleasure of seeing patient, Amitiel Cronin, at the [facility] at [facility] for neurologic consultation regarding her multiple sclerosis.  She is a 39 year old woman who was diagnosed with MS in 2012 after presenting with numbness in both arms.   She had an MRI and she was diagnosed with MS. I see an MRI from 2014 that showed a focus in the right pons and a punctate focus in the deep white matter of the left frontal lobe. She was initially placed on Aubagio.   She had compliance issues so switched to Ocrevus about 2-3 years ago.   She has been seeing Dr. Renata Caprice in [place], [place] but has just relocated to [facility]     She had an exacerbation with left arm numbness and visual changes while on Aubagio 08/27/2018.   She was switched to on Ocrevus and last MRI showed no new lesions.  Currently, she feels her gait is fine for short distances but her endurance is poor.   Her left side is weaker than her right side.   She has spasms in her arms, worse at night, that last several minutes.  They rarely happen during the day.   She has dysestrhestic pain in her left side  She reports fatigue and often  needs to break up her tasks to get them done.   She sleeps poorly some nights, especially if she has more pain.    She needs 9 hours of sleep to function well the next day.    She has some depression and is on fluoxetine. She denies cognitive dysfunction.     IMAGING STUDIES MRI brain 01/13/2013 showed a T2 hyperintense focus in the right posterior lower pons and 1 punctate focus in the deep white matter of the left frontal lobe. These did not enhance.   MRI brain/orbits 08/27/2018 showed a small enhancing focus at the left pontomedullary junction and small T2 hyperintense foci in the left occipital periventricular white matter, deep white matter of the left frontal lobe and deep white matter of the left parietal lobe. The focus seen in 2014 is not apparent on this MRI.  MRI brain 07/27/2020 showed a small  T2 hyperintensity in the left occipital periventricular white matter and another tiny punctate focus of T2 hyperintensity in the left frontal subcortical white matter and another in the deep left parietal white matter. No foci in the brainstem were noted on this MRI.Marland Kitchen No pathologicenhancement.There is a partially empty sella  MRI cervical spine 10/20/2019 showed a sSmall midline disc herniation at C3-4 associated with mild spinal stenosis towards the right and mild right foraminal narrowing but no nerve  root compression.. Severe foraminal stenosis C5-6 on the right that could compress the right C6 nerve root. No spinal cord lesion.    Labs:  PPD 06/05/2020 [facility]) was negative.   HepB sAg and HepBcAb IgM were negative 07/29/20  She did the Covid vaccination July 2021.  REVIEW OF SYSTEMS: Constitutional: No fevers, chills, sweats, or change in appetite Eyes: No visual changes, double vision, eye pain Ear, nose and throat: No hearing loss, ear pain, nasal congestion, sore throat Cardiovascular: No chest pain, palpitations Respiratory: No shortness of breath at rest or with exertion.   No  wheezes GastrointestinaI: No nausea, vomiting, diarrhea, abdominal pain, fecal incontinence Genitourinary: No dysuria, urinary retention or frequency.  No nocturia. Musculoskeletal: No neck pain, back pain Integumentary: No rash, pruritus, skin lesions Neurological: as above Psychiatric: No depression at this time.  No anxiety Endocrine: No palpitations, diaphoresis, change in appetite, change in weigh or increased thirst Hematologic/Lymphatic: No anemia, purpura, petechiae. Allergic/Immunologic: No itchy/runny eyes, nasal congestion, recent allergic reactions, rashes  ALLERGIES: Allergies  Allergen Reactions  . Sulfa Antibiotics Hives, Itching and Rash    HOME MEDICATIONS:  Current Outpatient Medications:  .  FLUoxetine (PROZAC) 40 MG capsule, Take 40 mg by mouth daily. , Disp: , Rfl:  .  fluticasone (FLONASE) 50 MCG/ACT nasal spray, Place 1-2 sprays into both nostrils daily as needed for allergies or rhinitis., Disp: , Rfl:  .  hydrochlorothiazide (HYDRODIURIL) 25 MG tablet, Take 25 mg by mouth daily., Disp: , Rfl:   PAST MEDICAL HISTORY: Past Medical History:  Diagnosis Date  . Anemia    "in my younger years; improved after hysterectomy"   (08/27/2018)  . Anxiety   . Depression   . Essential hypertension   . MS (multiple sclerosis) (HCC)   . Obesity     PAST SURGICAL HISTORY: Past Surgical History:  Procedure Laterality Date  . VAGINAL HYSTERECTOMY      FAMILY HISTORY: Family History  Problem Relation Age of Onset  . Multiple sclerosis Father     SOCIAL HISTORY:  Social History   Socioeconomic History  . Marital status: Single    Spouse name: Not on file  . Number of children: 2  . Years of education: 3  . Highest education level: Not on file  Occupational History  . Occupation: various part-time jobs  Tobacco Use  . Smoking status: Never Smoker  . Smokeless tobacco: Never Used  Vaping Use  . Vaping Use: Never used  Substance and Sexual Activity    . Alcohol use: Not Currently    Alcohol/week: 2.0 standard drinks    Types: 2 Standard drinks or equivalent per week  . Drug use: Yes    Types: Marijuana    Comment: 08/27/2018 "qd"  . Sexual activity: Yes  Other Topics Concern  . Not on file  Social History Narrative   Right handed      Caffeine use: none   Social Determinants of Health   Financial Resource Strain:   . Difficulty of Paying Living Expenses: Not on file  Food Insecurity:   . Worried About Programme researcher, broadcasting/film/video in the Last Year: Not on file  . Ran Out of Food in the Last Year: Not on file  Transportation Needs:   . Lack of Transportation (Medical): Not on file  . Lack of Transportation (Non-Medical): Not on file  Physical Activity:   . Days of Exercise per Week: Not on file  . Minutes of Exercise per Session: Not  on file  Stress:   . Feeling of Stress : Not on file  Social Connections:   . Frequency of Communication with Friends and Family: Not on file  . Frequency of Social Gatherings with Friends and Family: Not on file  . Attends Religious Services: Not on file  . Active Member of Clubs or Organizations: Not on file  . Attends Banker Meetings: Not on file  . Marital Status: Not on file  Intimate Partner Violence:   . Fear of Current or Ex-Partner: Not on file  . Emotionally Abused: Not on file  . Physically Abused: Not on file  . Sexually Abused: Not on file     PHYSICAL EXAM  Vitals:   08/10/20 1319  BP: (!) 148/91  Pulse: 74  Weight: 299 lb (135.6 kg)  Height: 5\' 8"  (1.727 m)    Body mass index is 45.46 kg/m.   Hearing Screening   125Hz  250Hz  500Hz  1000Hz  2000Hz  3000Hz  4000Hz  6000Hz  8000Hz   Right ear:           Left ear:             Visual Acuity Screening   Right eye Left eye Both eyes  Without correction: 20/50 20/70 20/50   With correction:       General: The patient is well-developed and well-nourished and in no acute distress  HEENT:  Head is [place]/AT.  Sclera are  anicteric.  Funduscopic exam shows normal optic discs and retinal vessels.  Neck: No carotid bruits are noted.  The neck is nontender.  Cardiovascular: The heart has a regular rate and rhythm with a normal S1 and S2. There were no murmurs, gallops or rubs.    Skin: Extremities are without rash or  edema.  Musculoskeletal:  Back is nontender  Neurologic Exam  Mental status: The patient is alert and oriented x 3 at the time of the examination. The patient has apparent normal recent and remote memory, with an apparently normal attention span and concentration ability.   Speech is normal.  Cranial nerves: Extraocular movements are full. Pupils are equal, round, and reactive to light and accomodation.  Reduced color saturation OD.   Facial symmetry is present. There is good facial sensation to soft touch bilaterally.Facial strength is normal.  Trapezius and sternocleidomastoid strength is normal. No dysarthria is noted.  The tongue is midline, and the patient has symmetric elevation of the soft palate. No obvious hearing deficits are noted.  Motor:  Muscle bulk is normal.   Tone is normal. Strength is  5 / 5 in all 4 extremities.   Sensory: Sensory testing is intact to pinprick, soft touch and vibration sensation on the right but altered touch/temperature on the left.     Coordination: Cerebellar testing reveals good finger-nose-finger and heel-to-shin bilaterally.  Gait and station: Station is normal.   Gait is normal. Tandem gait is normal. Romberg is negative.   Reflexes: Deep tendon reflexes are symmetric and increased bilaterally, 3 in arms, 3+ at knees, no ankle clonus.   Plantar responses are flexor.    DIAGNOSTIC DATA (LABS, IMAGING, TESTING) - I reviewed patient records, labs, notes, testing and imaging myself where available.  Lab Results  Component Value Date   WBC 7.5 07/26/2020   HGB 12.9 07/26/2020   HCT 38.0 07/26/2020   MCV 95.4 07/26/2020   PLT 305 07/26/2020        Component Value Date/Time   NA 142 07/26/2020 2230   K 3.8 07/26/2020  2230   CL 105 07/26/2020 2230   CO2 23 07/26/2020 2156   GLUCOSE 90 07/26/2020 2230   BUN 11 07/26/2020 2230   CREATININE 1.00 07/26/2020 2230   CALCIUM 9.1 07/26/2020 2156   PROT 6.2 (L) 07/26/2020 2156   ALBUMIN 3.6 07/26/2020 2156   AST 18 07/26/2020 2156   ALT 16 07/26/2020 2156   ALKPHOS 57 07/26/2020 2156   BILITOT 0.4 07/26/2020 2156   GFRNONAA >60 07/26/2020 2156   GFRAA >60 07/26/2020 2156       ASSESSMENT AND PLAN  Multiple sclerosis (HCC) - Plan: Stratify JCV Antibody Test ([facility]), Hepatitis B surface antibody,qualitative, Hepatitis B surface antigen, Hepatitis B core antibody, total, IgG, IgA, IgM, CBC with Differential/Platelet  Encounter for monitoring immunomodulating therapy - Plan: Stratify JCV Antibody Test ([facility]), Hepatitis B surface antibody,qualitative, Hepatitis B surface antigen, Hepatitis B core antibody, total, IgG, IgA, IgM, CBC with Differential/Platelet  Left-sided weakness  Left sided numbness  Muscle spasm   In summary, Ms. Taormina is a 39 year old woman who was diagnosed in 2012 with relapsing remitting MS. She had breakthrough disease on Aubagio (though compliance was poor which could have contributed). She has been on Ocrevus. We discussed treatment options. She has not had any exacerbations while on Ocrevus and we could continue that medication. Tysabri would be another option. She feels that she good likely have compliance issues if she was on an oral agent or a self injectable medication. I will check blood work for [employer] and Tysabri. Decreased Tysabri might be safer during the Covid pandemic, she would prefer that if she is JCV antibody negative.  To help with nocturnal spasticity and pain, I will send in a prescription for baclofen to take at bedtime and meloxicam to take once a day.  She will return to see me in 4 months or sooner if there are new or worsening  neurologic symptoms.  Danae Oland A. Epimenio Foot, MD, [facility] 08/10/2020, 2:11 PM Certified in Neurology, Clinical Neurophysiology, Sleep Medicine and Neuroimaging  [facility] [address] [place], [place] [address] [phone]

## 2020-08-10 NOTE — Telephone Encounter (Signed)
Received fax from [facility] in regards to [facility] ODT 15mg  tab (meloxicam): "product not available, please prescrib alternative"

## 2020-08-10 NOTE — Telephone Encounter (Signed)
Placed JCV lab in [facility] lock box for routine lab pick up. Results pending. 

## 2020-08-11 LAB — HEPATITIS B CORE ANTIBODY, TOTAL: Hep B Core Total Ab: NEGATIVE

## 2020-08-11 LAB — CBC WITH DIFFERENTIAL/PLATELET
Basophils Absolute: 0.1 10*3/uL (ref 0.0–0.2)
Basos: 1 %
EOS (ABSOLUTE): 0.1 10*3/uL (ref 0.0–0.4)
Eos: 1 %
Hematocrit: 40.1 % (ref 34.0–46.6)
Hemoglobin: 12.8 g/dL (ref 11.1–15.9)
Immature Grans (Abs): 0 10*3/uL (ref 0.0–0.1)
Immature Granulocytes: 0 %
Lymphocytes Absolute: 2.6 10*3/uL (ref 0.7–3.1)
Lymphs: 33 %
MCH: 30 pg (ref 26.6–33.0)
MCHC: 31.9 g/dL (ref 31.5–35.7)
MCV: 94 fL (ref 79–97)
Monocytes Absolute: 0.8 10*3/uL (ref 0.1–0.9)
Monocytes: 10 %
Neutrophils Absolute: 4.3 10*3/uL (ref 1.4–7.0)
Neutrophils: 55 %
Platelets: 254 10*3/uL (ref 150–450)
RBC: 4.26 x10E6/uL (ref 3.77–5.28)
RDW: 12.5 % (ref 11.7–15.4)
WBC: 7.9 10*3/uL (ref 3.4–10.8)

## 2020-08-11 LAB — IGG, IGA, IGM
IgA/Immunoglobulin A, Serum: 185 mg/dL (ref 87–352)
IgG (Immunoglobin G), Serum: 912 mg/dL (ref 586–1602)
IgM (Immunoglobulin M), Srm: 57 mg/dL (ref 26–217)

## 2020-08-11 LAB — HEPATITIS B SURFACE ANTIGEN: Hepatitis B Surface Ag: NEGATIVE

## 2020-08-11 LAB — HEPATITIS B SURFACE ANTIBODY,QUALITATIVE: Hep B Surface Ab, Qual: REACTIVE

## 2020-08-15 NOTE — Telephone Encounter (Signed)
Dr. Epimenio Foot advised me that pt's JCV was positive so pt should continue ocrevus. Pt does not need initial dose.  I called pt. She reports that Dr. Epimenio Foot spoke with her today regarding this. She believes her last ocrevus infusion was in Feb or March of 2021 and she is due for an infusion now. I advised her that her insurance will likely require her infusion to be done at the [facility] but I will confirm this and call her back tomorrow. Pt verbalized understanding.

## 2020-08-15 NOTE — Telephone Encounter (Signed)
JCV ab drawn on 08/10/20 positive, index: 2.16

## 2020-08-16 NOTE — Telephone Encounter (Signed)
I called pt. I advised her that the [facility]fusion suite will let us know if they accept her insurance. If not, we will send her referral to [facility]. We will let her know either way. Pt verbalized understanding.  Ocrevus order and start form completed and signed by Dr. Epimenio Foot. Pt does not need initial dose since she is established on ocrevus and is due for an infusion this month.  Start form faxed to [facility]. Received a receipt of confirmation.  Start form and order sent to MR for scanning.  Ocrevus order and referral given to [facility]fusion.

## 2020-08-16 NOTE — Telephone Encounter (Signed)
I spoke with Linane,RN in intrafusion and they may be able to take pt since her insurance is a medicaid paid plan through [facility].

## 2020-08-16 NOTE — Telephone Encounter (Signed)
I called [facility]. Spoke to [place]. She confirmed that they received the order and that the patient should call them to schedule.  I called pt and asked her to call [phone] to schedule her ocrevus infusion. Pt verbalized understanding.

## 2020-08-16 NOTE — Telephone Encounter (Signed)
Received this notice from [facility]: "We received a prior authorization request for the member and prescription listed above. The requested prescription only requires a cost-override due to the billed amount exceeding a preset cost limit. This override has been authorized until 11/15/2020. Additional review of this request is not needed by the [facility] Prior Authorization Team. Please have the dispensing pharmacy reprocess the claim. The pharmacy may obtain assistance by contacting the [facility] Help Desk anytime at [phone]."  I have sent this and the ocrevus order to the [facility]. Received a receipt of confirmation.

## 2020-08-16 NOTE — Telephone Encounter (Signed)
Liane, RN informed me that they do not accept pt's insurance. She will need to be infused at [facility].

## 2020-08-16 NOTE — Telephone Encounter (Signed)
PA for ocrevus completed via CMM. Key: [id number] - PA Case ID: [id number]. Sent to [facility] [employer].

## 2020-08-18 ENCOUNTER — Other Ambulatory Visit: Payer: Self-pay

## 2020-08-18 ENCOUNTER — Ambulatory Visit
Admission: RE | Admit: 2020-08-18 | Discharge: 2020-08-18 | Disposition: A | Discharge status: Home / Self Care | Payer: Medicaid Other | Source: Ambulatory Visit | Attending: Physician Assistant | Admitting: Physician Assistant

## 2020-08-18 DIAGNOSIS — Z1231 Encounter for screening mammogram for malignant neoplasm of breast: Secondary | ICD-10-CM

## 2020-08-22 NOTE — Telephone Encounter (Signed)
Pt does not appear to be scheduled for her ocrevus infusion at the [facility].  I called pt to discuss. No answer, left a message asking her to call us back.  If pt calls back please inquire when she plans to schedule her ocrevus infusion and encourage her to call [facility] at [phone].

## 2020-08-23 NOTE — Telephone Encounter (Signed)
I called pt to discuss. No answer, left a message asking her to call us back.  If pt calls back please inquire when she plans to schedule her ocrevus infusion and encourage her to call [facility] at [phone].

## 2020-08-29 NOTE — Telephone Encounter (Signed)
I called pt. She has not made her ocrevus infusion appt but plans on doing it today. She has their phone number.

## 2020-08-30 NOTE — Telephone Encounter (Signed)
I have given the pt the phone number to schedule her ocrevus infusion with the [facility] several times. She has not yet scheduled this appt.

## 2020-09-01 ENCOUNTER — Telehealth: Payer: Self-pay | Admitting: Neurology

## 2020-09-01 NOTE — Telephone Encounter (Signed)
Patient needs ocrevus  Sent  [facility]  . Patient states she called [facility] to schedule and they said ordered is not in Epic . Patient called [phone] .

## 2020-09-01 NOTE — Telephone Encounter (Signed)
Reviewed pt chart. KD,RN faxed orders to [facility] on 08/16/20. I re-faxed orders to them today, received fax confirmation (fax: [phone]). I called pt back to let her know we fax orders, not in epic. She should call them back at [phone] to schedule Ocrevus now that orders have been re-sent. She verbalized understanding and appreciation.

## 2020-09-06 NOTE — Telephone Encounter (Signed)
Received a fax from [facility] requesting a PA for ocrevus. I called [employer], spoke with [facility]. I advised her of the PA completed 08/16/2020 and that ocrevus only needs a cost override. This has been provided to [facility] as well. I offered to fax this to [place] but [facility] declined it. I also confirmed that pt should have her ocrevus infusion at [facility].

## 2020-09-13 ENCOUNTER — Inpatient Hospital Stay (HOSPITAL_COMMUNITY)
Admission: RE | Admit: 2020-09-13 | Discharge: 2020-09-13 | Disposition: A | Discharge status: Home / Self Care | Payer: Medicaid Other | Source: Ambulatory Visit

## 2020-09-15 ENCOUNTER — Other Ambulatory Visit: Payer: Self-pay

## 2020-09-15 ENCOUNTER — Ambulatory Visit (HOSPITAL_COMMUNITY)
Admission: RE | Admit: 2020-09-15 | Discharge: 2020-09-15 | Disposition: A | Discharge status: Home / Self Care | Payer: Medicaid Other | Source: Ambulatory Visit | Attending: Internal Medicine | Admitting: Internal Medicine

## 2020-09-15 DIAGNOSIS — G35 Multiple sclerosis: Secondary | ICD-10-CM | POA: Diagnosis present

## 2020-09-15 MED ORDER — SODIUM CHLORIDE 0.9 % IV SOLN
INTRAVENOUS | Status: DC | PRN
  Administered 2020-09-15: 250 mL via INTRAVENOUS

## 2020-09-15 MED ORDER — DIPHENHYDRAMINE HCL 50 MG/ML IJ SOLN
50.0000 mg | Freq: Once | INTRAMUSCULAR | Status: AC
  Administered 2020-09-15: 50 mg via INTRAVENOUS
  Filled 2020-09-15: qty 1

## 2020-09-15 MED ORDER — FAMOTIDINE IN NACL 20-0.9 MG/50ML-% IV SOLN
20.0000 mg | Freq: Once | INTRAVENOUS | Status: AC
  Administered 2020-09-15: 20 mg via INTRAVENOUS
  Filled 2020-09-15: qty 50

## 2020-09-15 MED ORDER — ACETAMINOPHEN 325 MG PO TABS
650.0000 mg | ORAL_TABLET | Freq: Once | ORAL | Status: AC
  Administered 2020-09-15: 650 mg via ORAL
  Filled 2020-09-15: qty 2

## 2020-09-15 MED ORDER — SODIUM CHLORIDE 0.9 % IV SOLN
600.0000 mg | Freq: Once | INTRAVENOUS | Status: AC
  Administered 2020-09-15: 600 mg via INTRAVENOUS
  Filled 2020-09-15: qty 20

## 2020-09-15 MED ORDER — METHYLPREDNISOLONE SODIUM SUCC 125 MG IJ SOLR
100.0000 mg | Freq: Once | INTRAMUSCULAR | Status: AC
  Administered 2020-09-15: 100 mg via INTRAVENOUS
  Filled 2020-09-15: qty 2

## 2020-09-15 NOTE — Discharge Instructions (Signed)
Ocrelizumab injection °What is this medicine? °OCRELIZUMAB (ok re LIZ ue mab) treats multiple sclerosis. It helps to decrease the number of multiple sclerosis relapses. It is not a cure. °This medicine may be used for other purposes; ask your health care provider or pharmacist if you have questions. °COMMON BRAND NAME(S): OCREVUS °What should I tell my health care provider before I take this medicine? °They need to know if you have any of these conditions: °· cancer °· hepatitis B infection °· other infection (especially a virus infection such as chickenpox, cold sores, or herpes) °· an unusual or allergic reaction to ocrelizumab, other medicines, foods, dyes or preservatives °· pregnant or trying to get pregnant °· breast-feeding °How should I use this medicine? °This medicine is for infusion into a vein. It is given by a health care professional in a hospital or clinic setting. °A special MedGuide will be given to you before each treatment. Be sure to read this information carefully each time. °Talk to your pediatrician regarding the use of this medicine in children. Special care may be needed. °Overdosage: If you think you have taken too much of this medicine contact a poison control center or emergency room at once. °NOTE: This medicine is only for you. Do not share this medicine with others. °What if I miss a dose? °Keep appointments for follow-up doses as directed. It is important not to miss your dose. Call your doctor or health care professional if you are unable to keep an appointment. °What may interact with this medicine? °· alemtuzumab °· daclizumab °· dimethyl fumarate °· fingolimod °· glatiramer °· interferon beta °· live virus vaccines °· mitoxantrone °· natalizumab °· peginterferon beta °· rituximab °· steroid medicines like prednisone or cortisone °· teriflunomide °This list may not describe all possible interactions. Give your health care provider a list of all the medicines, herbs,  non-prescription drugs, or dietary supplements you use. Also tell them if you smoke, drink alcohol, or use illegal drugs. Some items may interact with your medicine. °What should I watch for while using this medicine? °Tell your doctor or healthcare professional if your symptoms do not start to get better or if they get worse. °This medicine can cause serious allergic reactions. To reduce your risk you may need to take medicine before treatment with this medicine. Take your medicine as directed. °Women should inform their doctor if they wish to become pregnant or think they might be pregnant. There is a potential for serious side effects to an unborn child. Talk to your health care professional or pharmacist for more information. Female patients should use effective birth control methods while receiving this medicine and for 6 months after the last dose. °Call your doctor or health care professional for advice if you get a fever, chills or sore throat, or other symptoms of a cold or flu. Do not treat yourself. This drug decreases your body's ability to fight infections. Try to avoid being around people who are sick. °If you have a hepatitis B infection or a history of a hepatitis B infection, talk to your doctor. The symptoms of hepatitis B may get worse if you take this medicine. °In some patients, this medicine may cause a serious brain infection that may cause death. If you have any problems seeing, thinking, speaking, walking, or standing, tell your doctor right away. If you cannot reach your doctor, urgently seek other source of medical care. °This medicine can decrease the response to a vaccine. If you need to get   vaccinated, tell your healthcare professional if you have received this medicine. Extra booster doses may be needed. Talk to your doctor to see if a different vaccination schedule is needed. °Talk to your doctor about your risk of cancer. You may be more at risk for certain types of cancers if you  take this medicine. °What side effects may I notice from receiving this medicine? °Side effects that you should report to your doctor or health care professional as soon as possible: °· allergic reactions like skin rash, itching or hives, swelling of the face, lips, or tongue °· breathing problems °· facial flushing °· fast, irregular heartbeat °· lump or soreness in the breast °· signs and symptoms of herpes such as cold sore, shingles, or genital sores °· signs and symptoms of infection like fever or chills, cough, sore throat, pain or trouble passing urine °· signs and symptoms of low blood pressure like dizziness; feeling faint or lightheaded, falls; unusually weak or tired °· signs and symptoms of progressive multifocal leukoencephalopathy (PML) like changes in vision; clumsiness; confusion; personality changes; weakness on one side of the body °· swelling of the ankles, feet, hands °Side effects that usually do not require medical attention (report these to your doctor or health care professional if they continue or are bothersome): °· back pain °· depressed mood °· diarrhea °· pain, redness, or irritation at site where injected °This list may not describe all possible side effects. Call your doctor for medical advice about side effects. You may report side effects to FDA at 1-800-FDA-1088. °Where should I keep my medicine? °This drug is given in a hospital or clinic and will not be stored at home. °NOTE: This sheet is a summary. It may not cover all possible information. If you have questions about this medicine, talk to your doctor, pharmacist, or health care provider. °© 2020 Elsevier/Gold Standard (2018-11-24 07:41:53) ° °

## 2020-09-15 NOTE — Progress Notes (Signed)
PATIENT CARE CENTER NOTE  Diagnosis: Multiple Sclerosis (G35)   Provider: Despina Arias, MD   Procedure: Ocrevus 600 mg infusion   Note: Patient received Ocrevus infusion via PIV. Pre-medications given (Tylenol, Benadryl, Solu-medrol, Pepcid). Patient tolerated infusion well with no adverse reaction. Vital signs stable. Patient declined 1 hour observation post-infusion. Discharge instructions given. Patient alert, oriented and ambulatory at discharge.

## 2020-12-14 DIAGNOSIS — R590 Localized enlarged lymph nodes: Secondary | ICD-10-CM | POA: Diagnosis not present

## 2020-12-14 DIAGNOSIS — I10 Essential (primary) hypertension: Secondary | ICD-10-CM | POA: Diagnosis not present

## 2020-12-14 DIAGNOSIS — G35 Multiple sclerosis: Secondary | ICD-10-CM | POA: Diagnosis not present

## 2020-12-14 DIAGNOSIS — F3341 Major depressive disorder, recurrent, in partial remission: Secondary | ICD-10-CM | POA: Diagnosis not present

## 2020-12-21 ENCOUNTER — Ambulatory Visit: Payer: Medicaid Other | Admitting: Neurology

## 2020-12-21 ENCOUNTER — Encounter: Payer: Self-pay | Admitting: Neurology

## 2021-01-13 DIAGNOSIS — Z23 Encounter for immunization: Secondary | ICD-10-CM | POA: Diagnosis not present

## 2021-02-04 IMAGING — MG DIGITAL SCREENING BILAT W/ TOMO W/ CAD
8 series · 8 of 24 positions shown · non-contrast
Comparison: None.

CLINICAL DATA: Screening. This is the patient's initial baseline
mammogram.

EXAM:
DIGITAL SCREENING BILATERAL MAMMOGRAM WITH TOMO AND CAD

[L MLO synth-2D]
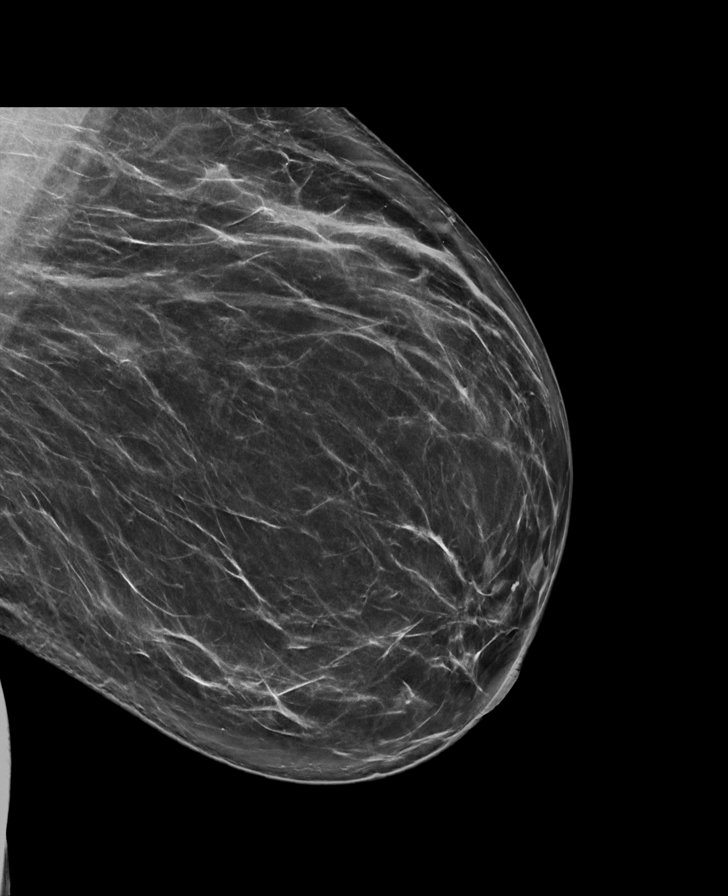

[L CC synth-2D]
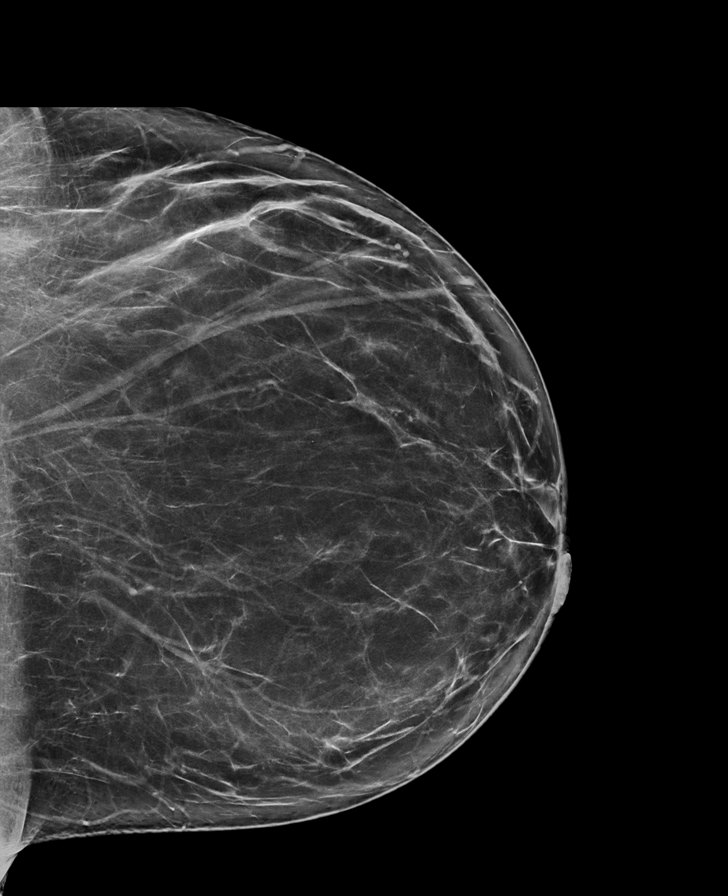

[R MLO synth-2D]
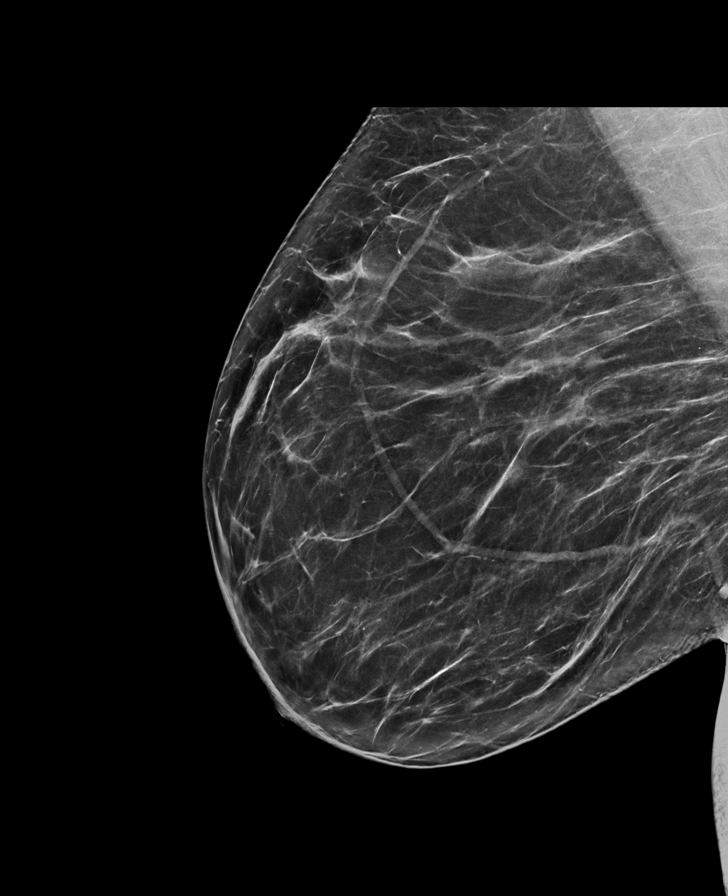

[R CC synth-2D]
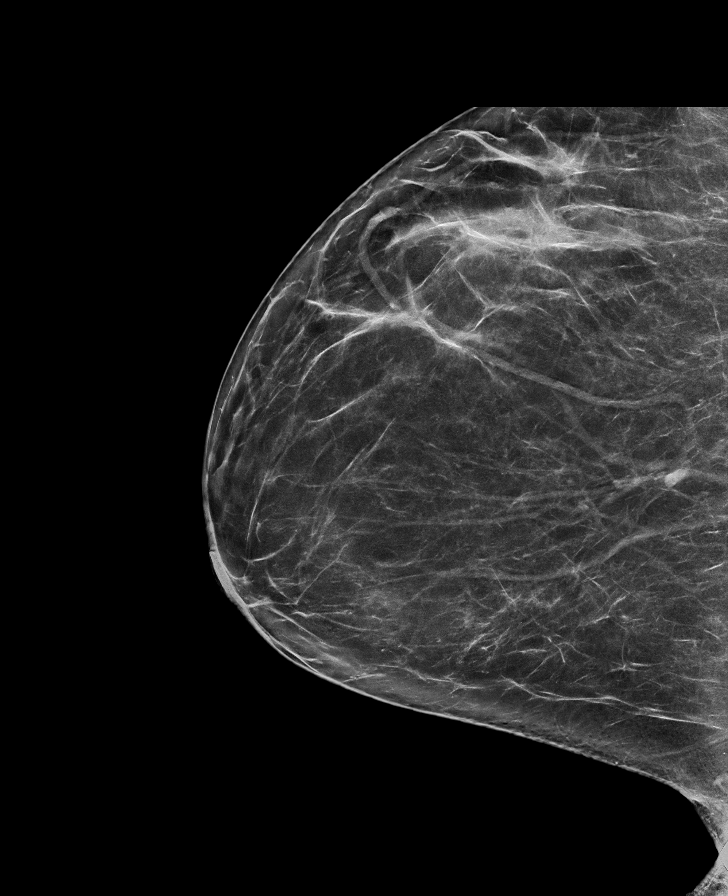

[R MLO tomo · tomo slice 43/85.0]
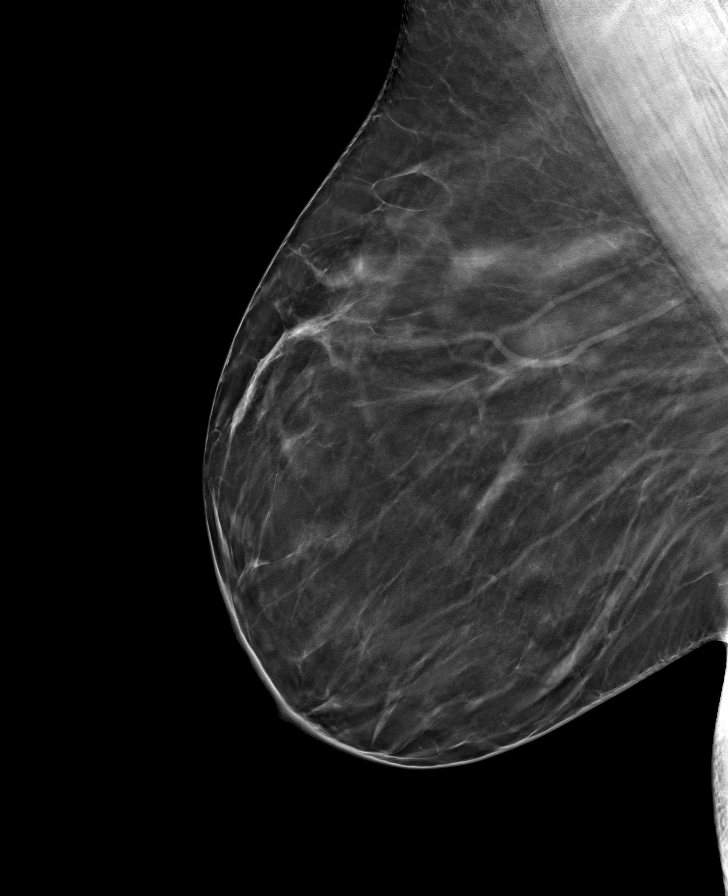

[R CC tomo · tomo slice 41/82.0]
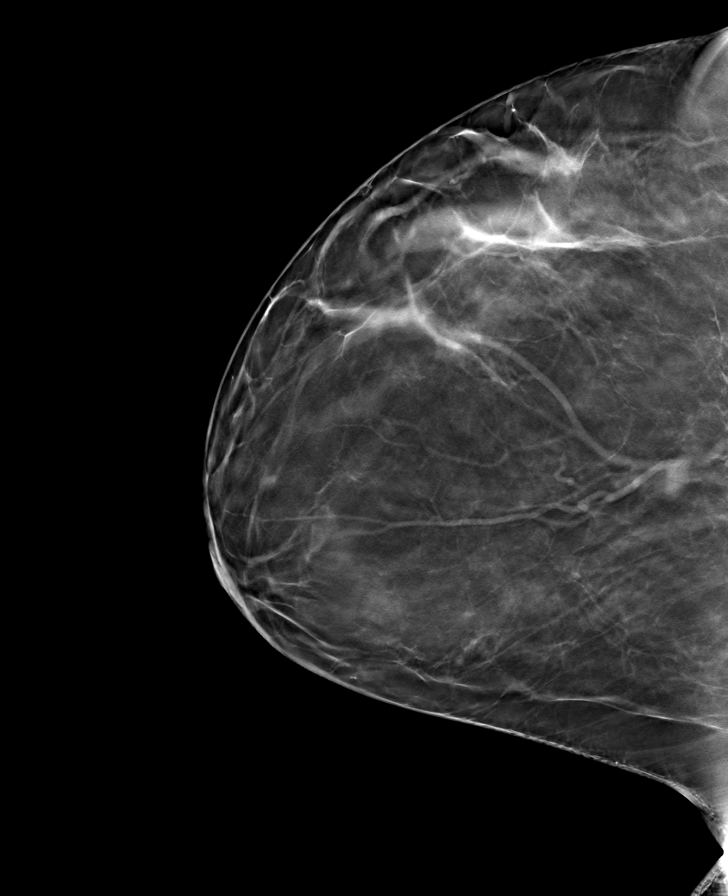

[L CC tomo · tomo slice 43/85.0]
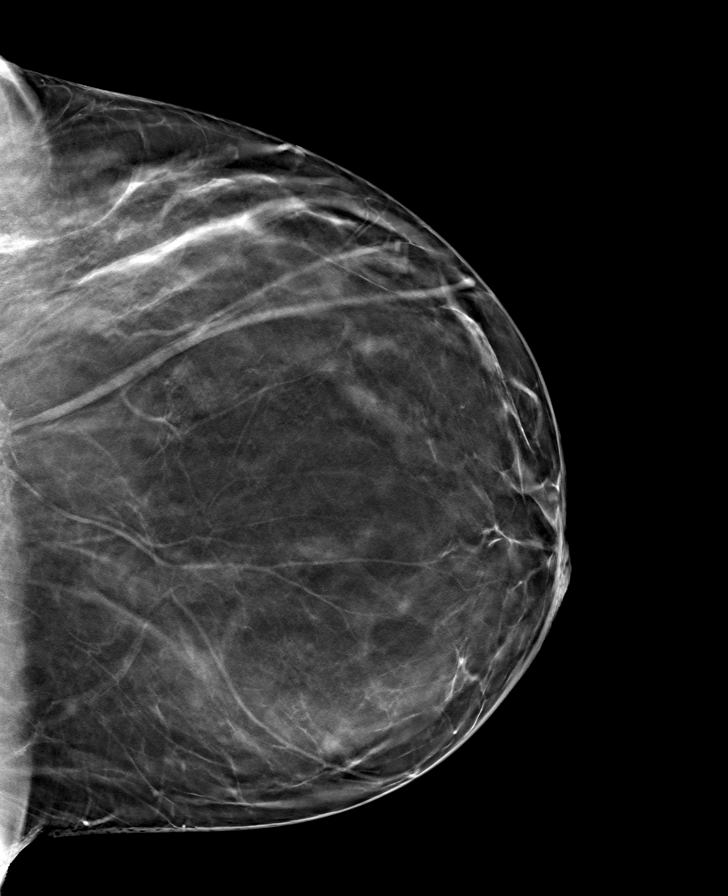

[L MLO tomo · tomo slice 44/87.0]
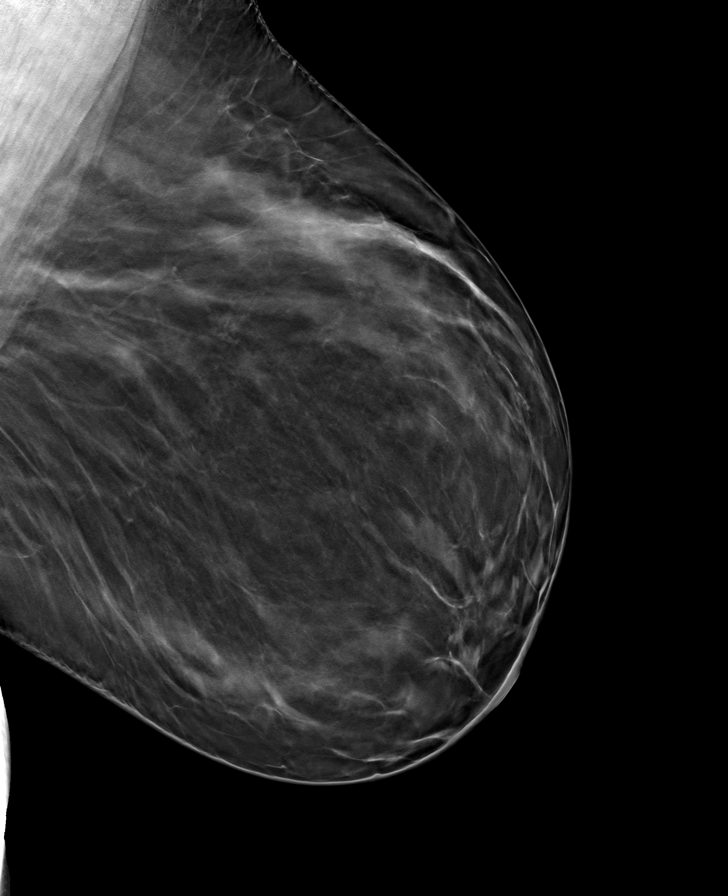

[8 of 24 positions shown; findings below may reference images not displayed]

ACR Breast Density Category b: There are scattered areas of
fibroglandular density.
FINDINGS: There are no findings suspicious for malignancy. Images were
processed with CAD.
IMPRESSION: No mammographic evidence of malignancy. A result letter of this
screening mammogram will be mailed directly to the patient.

RECOMMENDATION:
Screening mammogram in one year. (Code:[id number])

BI-RADS CATEGORY  1: Negative.

## 2021-03-14 ENCOUNTER — Telehealth: Payer: Self-pay | Admitting: *Deleted

## 2021-03-14 NOTE — Telephone Encounter (Signed)
Tried calling pt. VM not set up, could not leave message. No one else is listed on her DPR form to call.   We received request from [facility] for updated orders for her Ocrevus. I advised she would need to be seen prior to infusion. Last seen 08/2020, missed follow up 12/2020.

## 2021-03-15 NOTE — Telephone Encounter (Signed)
Tried calling pt again. Went to VM, VM not set up to be able to leave a message

## 2021-03-15 NOTE — Telephone Encounter (Signed)
Made [facility] aware that we cannot reach pt. If she shows for infusion, she will need to r/s and contact our office to schedule appt to be seen first. They will let her know if she shows for infusion.

## 2021-03-16 ENCOUNTER — Encounter (HOSPITAL_COMMUNITY): Payer: Medicaid Other

## 2021-06-19 DIAGNOSIS — Z111 Encounter for screening for respiratory tuberculosis: Secondary | ICD-10-CM | POA: Diagnosis not present

## 2021-06-19 DIAGNOSIS — F3341 Major depressive disorder, recurrent, in partial remission: Secondary | ICD-10-CM | POA: Diagnosis not present

## 2021-06-19 DIAGNOSIS — G35 Multiple sclerosis: Secondary | ICD-10-CM | POA: Diagnosis not present

## 2021-06-19 DIAGNOSIS — I10 Essential (primary) hypertension: Secondary | ICD-10-CM | POA: Diagnosis not present

## 2021-06-19 DIAGNOSIS — Z131 Encounter for screening for diabetes mellitus: Secondary | ICD-10-CM | POA: Diagnosis not present

## 2021-06-30 ENCOUNTER — Encounter (HOSPITAL_COMMUNITY): Payer: Medicaid Other

## 2021-07-26 DIAGNOSIS — G35 Multiple sclerosis: Secondary | ICD-10-CM | POA: Diagnosis not present

## 2021-07-26 DIAGNOSIS — I10 Essential (primary) hypertension: Secondary | ICD-10-CM | POA: Diagnosis not present

## 2021-07-26 DIAGNOSIS — F3341 Major depressive disorder, recurrent, in partial remission: Secondary | ICD-10-CM | POA: Diagnosis not present

## 2021-09-28 ENCOUNTER — Telehealth: Payer: Self-pay | Admitting: Neurology

## 2021-09-28 NOTE — Telephone Encounter (Signed)
Pt called stating that the Infusion Suite called her to cx her appt for 8am infusion because they are needing provider to renew the order. Please advise.

## 2021-09-28 NOTE — Telephone Encounter (Signed)
Called the patient back.  Was able to get her worked in next week for a follow-up appointment.  Patient excepted 11/1 at 930 with Amy.  Patient was appreciative for the call back.  I have notified the patient care infusion.  Informed them of the patient's appointment so they can get her Ocrevus rescheduled.  An order will be placed and sent to them through epic after the patient has her appointment

## 2021-09-29 ENCOUNTER — Encounter (HOSPITAL_COMMUNITY): Payer: Medicaid Other

## 2021-10-02 NOTE — Progress Notes (Signed)
Chief Complaint  Jennifer Arias presents with   Follow-up    Rm 10, alone. Here for MS f/u, on Ocrevus. Pt reports doing well. Pt reports having sharp pn on bottom of heels, doesn't think this is related to her MS. Pt needing refills. Last Ocrevus infusion was 8 months ago.      HISTORY OF PRESENT ILLNESS:  10/03/21 ALL:  Jennifer Arias is a 40 y.o. female here today for follow up for RRMS. She was seen in consult by Dr Epimenio Foot in 08/2020 and continued on Ocrevus. Last infusion was about 8 months ago. She missed last appt due to not feeling well. She is tolerating medication well. Last MRI stable 07/2020.   She is doing well, today. No new or exacerbating symptoms. Gait is good. She has a cane but does not need it. She works full time as a Lawyer in [employer]. She continues baclofen 1-2 times a week as needed for muscle spasms. She takes meloxicam as needed. She does have tightness and sharp pain in both feet/heels in the mornings when waking. She reports pain improves after walking around for a few minutes.   She reports mood is good. She is sleeping well. She denies vision or bowel/bladder changes.   HISTORY (copied from Dr Epimenio Foot previous note)  I had the pleasure of seeing Jennifer Arias, Jennifer Arias, at the [facility] at [facility] for neurologic consultation regarding her multiple sclerosis.   She is a 40 year old woman who was diagnosed with MS in 2012 after presenting with numbness in both arms.   She had an MRI and she was diagnosed with MS. I see an MRI from 2014 that showed a focus in the right pons and a punctate focus in the deep white matter of the left frontal lobe. She was initially placed on Aubagio.   She had compliance issues so switched to Ocrevus about 2-3 years ago.   She has been seeing Dr. Renata Caprice in [place], [place] but has just relocated to [facility].     She had an exacerbation with left arm numbness and visual changes while on Aubagio 08/27/2018.   She was  switched to on Ocrevus and last MRI showed no new lesions.   Currently, she feels her gait is fine for short distances but her endurance is poor.   Her left side is weaker than her right side.   She has spasms in her arms, worse at night, that last several minutes.  They rarely happen during the day.   She has dysestrhestic pain in her left side   She reports fatigue and often needs to break up her tasks to get them done.   She sleeps poorly some nights, especially if she has more pain.    She needs 9 hours of sleep to function well the next day.    She has some depression and is on fluoxetine. She denies cognitive dysfunction.      IMAGING STUDIES MRI brain 01/13/2013 showed a T2 hyperintense focus in the right posterior lower pons and 1 punctate focus in the deep white matter of the left frontal lobe. These did not enhance.    MRI brain/orbits 08/27/2018 showed a small enhancing focus at the left pontomedullary junction and small T2 hyperintense foci in the left occipital periventricular white matter, deep white matter of the left frontal lobe and deep white matter of the left parietal lobe. The focus seen in 2014 is not apparent on this MRI.  MRI brain 07/27/2020 showed a small  T2 hyperintensity in the left occipital periventricular white matter and another tiny punctate focus of T2 hyperintensity in the left frontal subcortical white matter and another in the deep left parietal white matter. No foci in the brainstem were noted on this MRI.Marland Kitchen No pathologic enhancement.There is a partially empty sella   MRI cervical spine 10/20/2019 showed a sSmall midline disc herniation at C3-4 associated with mild spinal stenosis towards the right and mild right foraminal narrowing but no nerve root compression.. Severe foraminal stenosis C5-6 on the right that could compress the right C6 nerve root. No spinal cord lesion.     Labs:  PPD 06/05/2020 [facility]) was negative.   HepB sAg and HepBcAb IgM were negative  07/29/20   She did the Covid vaccination July 2021.   REVIEW OF SYSTEMS: Out of a complete 14 system review of symptoms, the Jennifer Arias complains only of the following symptoms, bilateral foot pain and all other reviewed systems are negative.   ALLERGIES: Allergies  Allergen Reactions   Sulfa Antibiotics Hives, Itching and Rash     HOME MEDICATIONS: Outpatient Medications Prior to Visit  Medication Sig Dispense Refill   FLUoxetine (PROZAC) 40 MG capsule Take 40 mg by mouth daily.      fluticasone (FLONASE) 50 MCG/ACT nasal spray Place 1-2 sprays into both nostrils daily as needed for allergies or rhinitis.     Ocrelizumab (OCREVUS IV) Inject into the vein.     baclofen (LIORESAL) 10 MG tablet One to two po qHS 60 each 5   hydrochlorothiazide (HYDRODIURIL) 25 MG tablet Take 25 mg by mouth daily.     meloxicam (MOBIC) 15 MG tablet Take 1 tablet (15 mg total) by mouth daily. 30 tablet 5   No facility-administered medications prior to visit.     PAST MEDICAL HISTORY: Past Medical History:  Diagnosis Date   Anemia    "in my younger years; improved after hysterectomy"   (08/27/2018)   Anxiety    Depression    Essential hypertension    MS (multiple sclerosis) (HCC)    Obesity      PAST SURGICAL HISTORY: Past Surgical History:  Procedure Laterality Date   VAGINAL HYSTERECTOMY       FAMILY HISTORY: Family History  Problem Relation Age of Onset   Multiple sclerosis Father      SOCIAL HISTORY: Social History   Socioeconomic History   Marital status: Single    Spouse name: Not on file   Number of children: 2   Years of education: 12   Highest education level: Not on file  Occupational History   Occupation: various part-time jobs  Tobacco Use   Smoking status: Never   Smokeless tobacco: Never  Vaping Use   Vaping Use: Never used  Substance and Sexual Activity   Alcohol use: Not Currently    Alcohol/week: 2.0 standard drinks    Types: 2 Standard drinks or  equivalent per week   Drug use: Yes    Types: Marijuana    Comment: 08/27/2018 "qd"   Sexual activity: Yes  Other Topics Concern   Not on file  Social History Narrative   Right handed      Caffeine use: none   Social Determinants of Health   Financial Resource Strain: Not on file  Food Insecurity: Not on file  Transportation Needs: Not on file  Physical Activity: Not on file  Stress: Not on file  Social Connections: Not on file  Intimate Partner Violence: Not on file     PHYSICAL EXAM  Vitals:   10/03/21 0926  BP: 138/88  Pulse: 92  Weight: 267 lb (121.1 kg)  Height: 5\' 9"  (1.753 m)   Body mass index is 39.43 kg/m.  Generalized: Well developed, in no acute distress  Cardiology: normal rate and rhythm, no murmur auscultated  Respiratory: clear to auscultation bilaterally    Neurological examination  Mentation: Alert oriented to time, place, history taking. Follows all commands speech and language fluent Cranial nerve II-XII: Pupils were equal round reactive to light. Extraocular movements were full, visual field were full on confrontational test. Facial sensation and strength were normal. Uvula tongue midline. Head turning and shoulder shrug  were normal and symmetric. Motor: The motor testing reveals 5 over 5 strength of all 4 extremities. Good symmetric motor tone is noted throughout.  Sensory: Sensory testing is intact to soft touch on all 4 extremities. No evidence of extinction is noted.  Coordination: Cerebellar testing reveals good finger-nose-finger and heel-to-shin bilaterally.  Gait and station: Gait is normal. Tandem gait is normal. Romberg is negative. No drift is seen.  Reflexes: Deep tendon reflexes are symmetric and brisk in uppers, normal pattellar.    DIAGNOSTIC DATA (LABS, IMAGING, TESTING) - I reviewed Jennifer Arias records, labs, notes, testing and imaging myself where available.  Lab Results  Component Value Date   WBC 7.9 08/10/2020   HGB 12.8  08/10/2020   HCT 40.1 08/10/2020   MCV 94 08/10/2020   PLT 254 08/10/2020      Component Value Date/Time   NA 142 07/26/2020 2230   K 3.8 07/26/2020 2230   CL 105 07/26/2020 2230   CO2 23 07/26/2020 2156   GLUCOSE 90 07/26/2020 2230   BUN 11 07/26/2020 2230   CREATININE 1.00 07/26/2020 2230   CALCIUM 9.1 07/26/2020 2156   PROT 6.2 (L) 07/26/2020 2156   ALBUMIN 3.6 07/26/2020 2156   AST 18 07/26/2020 2156   ALT 16 07/26/2020 2156   ALKPHOS 57 07/26/2020 2156   BILITOT 0.4 07/26/2020 2156   GFRNONAA >60 07/26/2020 2156   GFRAA >60 07/26/2020 2156   No results found for: CHOL, HDL, LDLCALC, LDLDIRECT, TRIG, CHOLHDL No results found for: 2157 No results found for: VITAMINB12 No results found for: TSH  No flowsheet data found.   No flowsheet data found.   ASSESSMENT AND PLAN  40 y.o. year old female  has a past medical history of Anemia, Anxiety, Depression, Essential hypertension, MS (multiple sclerosis) (HCC), and Obesity. here with    Multiple sclerosis (HCC) - Plan: baclofen (LIORESAL) 10 MG tablet, CBC with Differential/Platelets, IgG, IgA, IgM  Bilateral foot pain   Jennifer Arias is doing very well, today. We will update labs. She has infusion scheduled next week. She will continue baclofen and meloxicam as needed. Foot pain most likely related to plantar fascitis. Info and stretching exercises provided. She will follow up with PCP for worsening symptoms. Follow up with Dr 45 in 6 months.   Orders Placed This Encounter  Procedures   CBC with Differential/Platelets   IgG, IgA, IgM      Meds ordered this encounter  Medications   baclofen (LIORESAL) 10 MG tablet    Sig: One to two po qHS    Dispense:  60 each    Refill:  5       Atzel Mccambridge, MSN, FNP-C 10/03/2021, 12:59 PM  [facility] [address] [place], [place] [phone]

## 2021-10-02 NOTE — Patient Instructions (Signed)
Below is our plan:  We will continue Ocrevus infusions. I will update labs, today. Next infusion next week.   Please make sure you are staying well hydrated. I recommend 50-60 ounces daily. Well balanced diet and regular exercise encouraged. Consistent sleep schedule with 6-8 hours recommended.   Please continue follow up with care team as directed.   Follow up with Dr Epimenio Foot in 4-6 months  You may receive a survey regarding today's visit. I encourage you to leave honest feed back as I do use this information to improve patient care. Thank you for seeing me today!

## 2021-10-03 ENCOUNTER — Ambulatory Visit: Payer: Medicaid Other | Admitting: Family Medicine

## 2021-10-03 ENCOUNTER — Encounter: Payer: Self-pay | Admitting: Family Medicine

## 2021-10-03 VITALS — BP 138/88 | HR 92 | Ht 69.0 in | Wt 267.0 lb

## 2021-10-03 DIAGNOSIS — M79672 Pain in left foot: Secondary | ICD-10-CM

## 2021-10-03 DIAGNOSIS — M79671 Pain in right foot: Secondary | ICD-10-CM

## 2021-10-03 DIAGNOSIS — G35 Multiple sclerosis: Secondary | ICD-10-CM | POA: Diagnosis not present

## 2021-10-03 MED ORDER — BACLOFEN 10 MG PO TABS: ORAL_TABLET | ORAL | 5 refills | Status: DC

## 2021-10-05 ENCOUNTER — Other Ambulatory Visit: Payer: Self-pay | Admitting: *Deleted

## 2021-10-05 ENCOUNTER — Telehealth: Payer: Self-pay

## 2021-10-05 DIAGNOSIS — G35 Multiple sclerosis: Secondary | ICD-10-CM

## 2021-10-05 LAB — CBC WITH DIFFERENTIAL/PLATELET
Basophils Absolute: 0 10*3/uL (ref 0.0–0.2)
Basos: 1 %
EOS (ABSOLUTE): 0.1 10*3/uL (ref 0.0–0.4)
Eos: 1 %
Hematocrit: 38.6 % (ref 34.0–46.6)
Hemoglobin: 13 g/dL (ref 11.1–15.9)
Immature Grans (Abs): 0 10*3/uL (ref 0.0–0.1)
Immature Granulocytes: 0 %
Lymphocytes Absolute: 1.7 10*3/uL (ref 0.7–3.1)
Lymphs: 32 %
MCH: 31.7 pg (ref 26.6–33.0)
MCHC: 33.7 g/dL (ref 31.5–35.7)
MCV: 94 fL (ref 79–97)
Monocytes Absolute: 0.5 10*3/uL (ref 0.1–0.9)
Monocytes: 10 %
Neutrophils Absolute: 3 10*3/uL (ref 1.4–7.0)
Neutrophils: 56 %
Platelets: 259 10*3/uL (ref 150–450)
RBC: 4.1 x10E6/uL (ref 3.77–5.28)
RDW: 11.7 % (ref 11.7–15.4)
WBC: 5.3 10*3/uL (ref 3.4–10.8)

## 2021-10-05 LAB — IGG, IGA, IGM
IgA/Immunoglobulin A, Serum: 188 mg/dL (ref 87–352)
IgG (Immunoglobin G), Serum: 935 mg/dL (ref 586–1602)
IgM (Immunoglobulin M), Srm: 50 mg/dL (ref 26–217)

## 2021-10-05 NOTE — Telephone Encounter (Signed)
-----   Message from Shawnie Dapper, NP sent at 10/05/2021  7:23 AM EDT ----- Labs are normal. Continue current treatment plan.

## 2021-10-05 NOTE — Telephone Encounter (Signed)
Called and relayed message to pt per Amy, NP. Pt verbalized understanding and had no further questions at this time.

## 2021-10-05 NOTE — Progress Notes (Signed)
See telephone note from 10/05/21.

## 2021-10-10 ENCOUNTER — Other Ambulatory Visit: Payer: Self-pay

## 2021-10-10 ENCOUNTER — Non-Acute Institutional Stay (HOSPITAL_COMMUNITY)
Admission: RE | Admit: 2021-10-10 | Discharge: 2021-10-10 | Disposition: A | Discharge status: Home / Self Care | Payer: Medicaid Other | Source: Ambulatory Visit | Attending: Internal Medicine | Admitting: Internal Medicine

## 2021-10-10 DIAGNOSIS — G35 Multiple sclerosis: Secondary | ICD-10-CM | POA: Diagnosis present

## 2021-10-10 MED ORDER — ACETAMINOPHEN 325 MG PO TABS
650.0000 mg | ORAL_TABLET | Freq: Once | ORAL | Status: AC
  Administered 2021-10-10: 650 mg via ORAL
  Filled 2021-10-10: qty 2

## 2021-10-10 MED ORDER — METHYLPREDNISOLONE SODIUM SUCC 125 MG IJ SOLR
100.0000 mg | Freq: Once | INTRAMUSCULAR | Status: AC
  Administered 2021-10-10: 100 mg via INTRAVENOUS
  Filled 2021-10-10: qty 2

## 2021-10-10 MED ORDER — FAMOTIDINE IN NACL 20-0.9 MG/50ML-% IV SOLN
20.0000 mg | Freq: Once | INTRAVENOUS | Status: AC
  Administered 2021-10-10: 20 mg via INTRAVENOUS
  Filled 2021-10-10: qty 50

## 2021-10-10 MED ORDER — DIPHENHYDRAMINE HCL 50 MG/ML IJ SOLN
25.0000 mg | Freq: Once | INTRAMUSCULAR | Status: AC
  Administered 2021-10-10: 25 mg via INTRAVENOUS
  Filled 2021-10-10: qty 1

## 2021-10-10 MED ORDER — SODIUM CHLORIDE 0.9 % IV SOLN
600.0000 mg | Freq: Once | INTRAVENOUS | Status: AC
  Administered 2021-10-10: 600 mg via INTRAVENOUS
  Filled 2021-10-10: qty 20

## 2021-10-10 MED ORDER — SODIUM CHLORIDE 0.9 % IV SOLN: INTRAVENOUS | Status: DC | PRN

## 2021-10-10 NOTE — Progress Notes (Addendum)
PATIENT CARE CENTER NOTE    Diagnosis: Multiple sclerosis (HCC) (G35)     Provider: Shawnie Dapper, NP     Procedure: Ocrevus 600 mg infusion     Note: Patient received Ocrevus infusion via PIV. Pre-medications given (Tylenol, Benadryl, Solu-medrol, Pepcid). Infusion titrated per protocol. Patient tolerated infusion well with no adverse reaction. Vital signs stable. Patient declined 1 hour observation post-infusion. AVS offered but patient refused. Patient advised to schedule next infusion for 6 months at the front dest. Patient alert, oriented and ambulatory at discharge.

## 2021-11-07 DIAGNOSIS — N760 Acute vaginitis: Secondary | ICD-10-CM | POA: Diagnosis not present

## 2021-11-07 DIAGNOSIS — F3341 Major depressive disorder, recurrent, in partial remission: Secondary | ICD-10-CM | POA: Diagnosis not present

## 2021-11-07 DIAGNOSIS — G35 Multiple sclerosis: Secondary | ICD-10-CM | POA: Diagnosis not present

## 2021-11-07 DIAGNOSIS — I10 Essential (primary) hypertension: Secondary | ICD-10-CM | POA: Diagnosis not present

## 2021-12-12 DIAGNOSIS — F3341 Major depressive disorder, recurrent, in partial remission: Secondary | ICD-10-CM | POA: Diagnosis not present

## 2021-12-12 DIAGNOSIS — R6882 Decreased libido: Secondary | ICD-10-CM | POA: Diagnosis not present

## 2021-12-12 DIAGNOSIS — N898 Other specified noninflammatory disorders of vagina: Secondary | ICD-10-CM | POA: Diagnosis not present

## 2021-12-12 DIAGNOSIS — I10 Essential (primary) hypertension: Secondary | ICD-10-CM | POA: Diagnosis not present

## 2021-12-12 DIAGNOSIS — G35 Multiple sclerosis: Secondary | ICD-10-CM | POA: Diagnosis not present

## 2021-12-12 DIAGNOSIS — Z0000 Encounter for general adult medical examination without abnormal findings: Secondary | ICD-10-CM | POA: Diagnosis not present

## 2021-12-12 DIAGNOSIS — Z131 Encounter for screening for diabetes mellitus: Secondary | ICD-10-CM | POA: Diagnosis not present

## 2022-01-31 DIAGNOSIS — E782 Mixed hyperlipidemia: Secondary | ICD-10-CM | POA: Diagnosis not present

## 2022-01-31 DIAGNOSIS — Z87828 Personal history of other (healed) physical injury and trauma: Secondary | ICD-10-CM | POA: Diagnosis not present

## 2022-01-31 DIAGNOSIS — F3341 Major depressive disorder, recurrent, in partial remission: Secondary | ICD-10-CM | POA: Diagnosis not present

## 2022-01-31 DIAGNOSIS — G35 Multiple sclerosis: Secondary | ICD-10-CM | POA: Diagnosis not present

## 2022-01-31 DIAGNOSIS — I10 Essential (primary) hypertension: Secondary | ICD-10-CM | POA: Diagnosis not present

## 2022-02-08 ENCOUNTER — Ambulatory Visit: Payer: Medicaid Other | Admitting: Neurology

## 2022-02-08 ENCOUNTER — Encounter: Payer: Self-pay | Admitting: Neurology

## 2022-03-22 DIAGNOSIS — Z1152 Encounter for screening for COVID-19: Secondary | ICD-10-CM | POA: Diagnosis not present

## 2022-03-30 DIAGNOSIS — Z1152 Encounter for screening for COVID-19: Secondary | ICD-10-CM | POA: Diagnosis not present

## 2022-04-05 ENCOUNTER — Telehealth: Payer: Self-pay | Admitting: *Deleted

## 2022-04-05 ENCOUNTER — Telehealth (HOSPITAL_COMMUNITY): Payer: Self-pay | Admitting: *Deleted

## 2022-04-05 NOTE — Telephone Encounter (Signed)
Spoke with Kara Mead, RN at [facility] via secure chat. Patient scheduled for Ocrevus infusion next week and was needing new orders placed. Per Kara Mead, patient needs to follow-up with Dr. Epimenio Foot before orders can be placed and Ocrevus infusion given. Patient will cancel next weeks appointment and will reschedule after follow-up with Dr. Epimenio Foot.   ?

## 2022-04-05 NOTE — Telephone Encounter (Signed)
Called pt. She is past due for f/u. Needs to be seen before her next Ocrevus infusion. Scheduled work in appt for 04/17/22 at 1:30pm with Dr. Epimenio Foot. She will cx infusion 04/10/22 and r/s until after her appt with Dr. Epimenio Foot. Infusion site notified.  ?

## 2022-04-10 ENCOUNTER — Inpatient Hospital Stay (HOSPITAL_COMMUNITY)
Admission: RE | Admit: 2022-04-10 | Discharge: 2022-04-10 | Disposition: A | Discharge status: Home / Self Care | Payer: Medicaid Other | Source: Ambulatory Visit

## 2022-04-17 ENCOUNTER — Ambulatory Visit: Payer: Medicaid Other | Admitting: Neurology

## 2022-04-17 ENCOUNTER — Encounter: Payer: Self-pay | Admitting: Neurology

## 2022-04-17 VITALS — BP 150/94 | HR 98 | Ht 69.0 in | Wt 261.6 lb

## 2022-04-17 DIAGNOSIS — M62838 Other muscle spasm: Secondary | ICD-10-CM

## 2022-04-17 DIAGNOSIS — R531 Weakness: Secondary | ICD-10-CM

## 2022-04-17 DIAGNOSIS — M79671 Pain in right foot: Secondary | ICD-10-CM | POA: Diagnosis not present

## 2022-04-17 DIAGNOSIS — M79672 Pain in left foot: Secondary | ICD-10-CM

## 2022-04-17 DIAGNOSIS — G35 Multiple sclerosis: Secondary | ICD-10-CM | POA: Diagnosis not present

## 2022-04-17 DIAGNOSIS — Z79899 Other long term (current) drug therapy: Secondary | ICD-10-CM

## 2022-04-17 NOTE — Progress Notes (Signed)
? ?[facility] ? ?PATIENT: Jennifer Arias ?DOB: 30-Sep-1981 ? ?REFERRING DOCTOR OR PCP:  Dr. Julio Sicks ?SOURCE: Patient, notes from primary care, imaging and lab reports, MRI images personally reviewed. ? ?_________________________________ ? ? ?HISTORICAL ? ?CHIEF COMPLAINT:  ?Chief Complaint  ?Patient presents with  ? Follow-up  ?  Rm 16, alone. Here to f/u for MS, on Ocrevus and tolerating well. Last infusion date: 10/10/2021 and Next infusion date:TBD. MS stable. No concerns.   ? ? ?HISTORY OF PRESENT ILLNESS:  ?Jennifer Arias is a 41 y.o. woman with multiple sclerosis. ? ?Update 04/17/2022: ?She is on Ocrevus and due for her next infusion.   She is tolerating the infusions well.    ? ?Currently, she feels stable.    She is walking well but gets foot pain if she walks longer.   She uses the bannister on stairs.  Her left side is weaker than her right side but stronger than last year   She has spasms in her arms, worse at night, that last several minutes.   These are helped by baclofen.  They rarely happen during the day.   She has dysestrhestic pain in her left side, usually mild but sometimes more severe for a short time.    ? ?She reports less fatigue the past year but this is still an issue for her.    She needs 9 hours of sleep to function well the next day.    She has some depression and is on fluoxetine. She denies cognitive dysfunction.    ? ? ?MS History: ?She was diagnosed with MS in 2012 after presenting with numbness in both arms.   She had an MRI and she was diagnosed with MS. I see an MRI from 2014 that showed a focus in the right pons and a punctate focus in the deep white matter of the left frontal lobe. She was initially placed on Aubagio.   She had compliance issues so switched to Ocrevus about 2-3 years ago.   She has been seeing Dr. Renata Caprice in [place], [place] but has just relocated to [facility].     She had an exacerbation with left arm numbness and visual changes while on  Aubagio 08/27/2018.   She was switched to on Ocrevus in late 2019 ? ?IMAGING STUDIES ?MRI brain 01/13/2013 showed a T2 hyperintense focus in the right posterior lower pons and 1 punctate focus in the deep white matter of the left frontal lobe. These did not enhance.  ? ?MRI brain/orbits 08/27/2018 showed a small enhancing focus at the left pontomedullary junction and small T2 hyperintense foci in the left occipital periventricular white matter, deep white matter of the left frontal lobe and deep white matter of the left parietal lobe. The focus seen in 2014 is not apparent on this MRI. ? ?MRI cervical spine 07/27/2020 showed a small midline disc herniation at C3-4 associated with mild spinal stenosis towards the right and mild right foraminal narrowing but no nerve root compression.. Severe foraminal stenosis C5-6 on the right that could compress the right C6 nerve root. No spinal cord lesion.   ? ?MRI brain 07/27/2020 showed a small  T2 hyperintensity in the left occipital periventricular white matter and another tiny punctate focus of T2 hyperintensity in the left frontal subcortical white matter and another in the deep left parietal white matter. No foci in the brainstem were noted on this MRI.Marland Kitchen No pathologic enhancement.There is a partially empty sella ? ?Labs:  PPD 06/05/2020 [facility]) was negative.   HepB sAg and HepBcAb IgM were negative 07/29/20 ? ?She did the Covid vaccination July 2021. ? ?REVIEW OF SYSTEMS: ?Constitutional: No fevers, chills, sweats, or change in appetite ?Eyes: No visual changes, double vision, eye pain ?Ear, nose and throat: No hearing loss, ear pain, nasal congestion, sore throat ?Cardiovascular: No chest pain, palpitations ?Respiratory:  No shortness of breath at rest or with exertion.   No wheezes ?GastrointestinaI: No nausea, vomiting, diarrhea, abdominal pain, fecal incontinence ?Genitourinary:  No dysuria, urinary retention or frequency.  No nocturia. ?Musculoskeletal:  No neck pain,  back pain ?Integumentary: No rash, pruritus, skin lesions ?Neurological: as above ?Psychiatric: No depression at this time.  No anxiety ?Endocrine: No palpitations, diaphoresis, change in appetite, change in weigh or increased thirst ?Hematologic/Lymphatic:  No anemia, purpura, petechiae. ?Allergic/Immunologic: No itchy/runny eyes, nasal congestion, recent allergic reactions, rashes ? ?ALLERGIES: ?Allergies  ?Allergen Reactions  ? Sulfa Antibiotics Hives, Itching and Rash  ? ? ?HOME MEDICATIONS: ? ?Current Outpatient Medications:  ?  baclofen (LIORESAL) 10 MG tablet, One to two po qHS, Disp: 60 each, Rfl: 5 ?  FLUoxetine (PROZAC) 40 MG capsule, Take 40 mg by mouth daily. , Disp: , Rfl:  ?  fluticasone (FLONASE) 50 MCG/ACT nasal spray, Place 1-2 sprays into both nostrils daily as needed for allergies or rhinitis., Disp: , Rfl:  ?  Ocrelizumab (OCREVUS IV), Inject into the vein., Disp: , Rfl:  ? ?PAST MEDICAL HISTORY: ?Past Medical History:  ?Diagnosis Date  ? Anemia   ? "in my younger years; improved after hysterectomy"   (08/27/2018)  ? Anxiety   ? Depression   ? Essential hypertension   ? MS (multiple sclerosis) (HCC)   ? Obesity   ? ? ?PAST SURGICAL HISTORY: ?Past Surgical History:  ?Procedure Laterality Date  ? VAGINAL HYSTERECTOMY    ? ? ?FAMILY HISTORY: ?Family History  ?Problem Relation Age of Onset  ? Multiple sclerosis Father   ? ? ?SOCIAL HISTORY: ? ?Social History  ? ?Socioeconomic History  ? Marital status: Single  ?  Spouse name: Not on file  ? Number of children: 2  ? Years of education: 97  ? Highest education level: Not on file  ?Occupational History  ? Occupation: various part-time jobs  ?Tobacco Use  ? Smoking status: Never  ? Smokeless tobacco: Never  ?Vaping Use  ? Vaping Use: Never used  ?Substance and Sexual Activity  ? Alcohol use: Not Currently  ?  Alcohol/week: 2.0 standard drinks  ?  Types: 2 Standard drinks or equivalent per week  ? Drug use: Yes  ?  Types: Marijuana  ?  Comment: 08/27/2018  "qd"  ? Sexual activity: Yes  ?Other Topics Concern  ? Not on file  ?Social History Narrative  ? Right handed  ?   ? Caffeine use: none  ? ?Social Determinants of Health  ? ?Financial Resource Strain: Not on file  ?Food Insecurity: Not on file  ?Transportation Needs: Not on file  ?Physical Activity: Not on file  ?Stress: Not on file  ?Social Connections: Not on file  ?Intimate Partner Violence: Not on file  ? ? ? ?PHYSICAL EXAM ? ?Vitals:  ? 04/17/22 1336  ?BP: (!) 150/94  ?Pulse: 98  ?Weight: 261 lb 9.6 oz (118.7 kg)  ?Height: 5\' 9"  (1.753 m)  ? ? ? ?Body mass index is 38.63 kg/m?. ? ?No results found. ? ? ?General: The patient is well-developed and well-nourished and in no acute distress ? ?Skin:  Extremities are without rash or  edema. ? ?Musculoskeletal:  Back is nontender ? ?Neurologic Exam ? ?Mental status: The patient is alert and oriented x 3 at the time of the examination. The patient has apparent normal recent and remote memory, with an apparently normal attention span and concentration ability.   Speech is normal. ? ?Cranial nerves: Extraocular movements are full. Pupils are equal, round, and reactive to light and accomodation.  Color vision is mildly reduced on the right.  Facial strength and sensation were normal.. No obvious hearing deficits are noted. ? ?Motor:  Muscle bulk is normal.   Tone is normal. Strength is  5 / 5 in all 4 extremities.  ? ?Sensory: Sensory testing is intact to touch and vibration on today's exam. ? ?Coordination: Cerebellar testing reveals good finger-nose-finger and heel-to-shin bilaterally. ? ?Gait and station: Station is normal.   Gait is normal. Tandem gait is fairly normal. Romberg is negative.  ? ?Reflexes: Deep tendon reflexes are symmetric and increased bilaterally, 3 in arms, 3+ at knees, no ankle clonus.  ? ? ? ?DIAGNOSTIC DATA (LABS, IMAGING, TESTING) ?- I reviewed patient records, labs, notes, testing and imaging myself where available. ? ?Lab Results  ?Component  Value Date  ? WBC 5.3 10/03/2021  ? HGB 13.0 10/03/2021  ? HCT 38.6 10/03/2021  ? MCV 94 10/03/2021  ? PLT 259 10/03/2021  ? ?   ?Component Value Date/Time  ? NA 142 07/26/2020 2230  ? K 3.8 07/26/2020 2230

## 2022-04-18 ENCOUNTER — Telehealth: Payer: Self-pay | Admitting: Neurology

## 2022-04-18 LAB — CBC WITH DIFFERENTIAL/PLATELET
Basophils Absolute: 0 10*3/uL (ref 0.0–0.2)
Basos: 1 %
EOS (ABSOLUTE): 0.1 10*3/uL (ref 0.0–0.4)
Eos: 1 %
Hematocrit: 37.9 % (ref 34.0–46.6)
Hemoglobin: 13.1 g/dL (ref 11.1–15.9)
Immature Grans (Abs): 0 10*3/uL (ref 0.0–0.1)
Immature Granulocytes: 0 %
Lymphocytes Absolute: 1.9 10*3/uL (ref 0.7–3.1)
Lymphs: 26 %
MCH: 32.1 pg (ref 26.6–33.0)
MCHC: 34.6 g/dL (ref 31.5–35.7)
MCV: 93 fL (ref 79–97)
Monocytes Absolute: 0.8 10*3/uL (ref 0.1–0.9)
Monocytes: 10 %
Neutrophils Absolute: 4.6 10*3/uL (ref 1.4–7.0)
Neutrophils: 62 %
Platelets: 232 10*3/uL (ref 150–450)
RBC: 4.08 x10E6/uL (ref 3.77–5.28)
RDW: 12.3 % (ref 11.7–15.4)
WBC: 7.5 10*3/uL (ref 3.4–10.8)

## 2022-04-18 LAB — IGG, IGA, IGM
IgA/Immunoglobulin A, Serum: 182 mg/dL (ref 87–352)
IgG (Immunoglobin G), Serum: 867 mg/dL (ref 586–1602)
IgM (Immunoglobulin M), Srm: 47 mg/dL (ref 26–217)

## 2022-04-18 NOTE — Telephone Encounter (Signed)
-----   Message from Asa Lente, MD sent at 04/18/2022  8:36 AM EDT ----- ?Please let her know that the lab work was fine.  Additionally, please let the infusion suite know that the IgG/IgM were fine  ?

## 2022-04-18 NOTE — Telephone Encounter (Signed)
Called the pt and advised of the normal lab results. Pt verbalized understanding. Pt had no questions at this time but was encouraged to call back if questions arise.  

## 2022-04-23 ENCOUNTER — Other Ambulatory Visit: Payer: Self-pay | Admitting: *Deleted

## 2022-04-23 DIAGNOSIS — Z79899 Other long term (current) drug therapy: Secondary | ICD-10-CM

## 2022-04-23 DIAGNOSIS — G35 Multiple sclerosis: Secondary | ICD-10-CM

## 2022-04-23 MED ORDER — SODIUM CHLORIDE 0.9 % IV SOLN: 20.0000 mg | Freq: Once | INTRAVENOUS | Status: DC

## 2022-04-23 MED ORDER — DIPHENHYDRAMINE HCL 50 MG/ML IJ SOLN: 50.0000 mg | Freq: Once | INTRAMUSCULAR | Status: AC

## 2022-04-23 MED ORDER — ACETAMINOPHEN 325 MG PO TABS: 650.0000 mg | ORAL_TABLET | Freq: Once | ORAL | Status: DC

## 2022-04-23 MED ORDER — SODIUM CHLORIDE 0.9 % IV SOLN: 600.0000 mg | Freq: Once | INTRAVENOUS | Status: AC

## 2022-04-23 MED ORDER — SODIUM CHLORIDE 0.9 % IV SOLN: 125.0000 mg | Freq: Once | INTRAVENOUS | Status: DC

## 2022-04-25 ENCOUNTER — Non-Acute Institutional Stay (HOSPITAL_COMMUNITY)
Admission: RE | Admit: 2022-04-25 | Discharge: 2022-04-25 | Disposition: A | Discharge status: Home / Self Care | Payer: Medicaid Other | Source: Ambulatory Visit | Attending: Internal Medicine | Admitting: Internal Medicine

## 2022-04-25 DIAGNOSIS — G35 Multiple sclerosis: Secondary | ICD-10-CM | POA: Insufficient documentation

## 2022-04-25 MED ORDER — SODIUM CHLORIDE 0.9 % IV SOLN: INTRAVENOUS | Status: DC | PRN

## 2022-04-25 MED ORDER — DIPHENHYDRAMINE HCL 50 MG/ML IJ SOLN
50.0000 mg | Freq: Once | INTRAMUSCULAR | Status: AC
  Administered 2022-04-25: 50 mg via INTRAVENOUS
  Filled 2022-04-25: qty 1

## 2022-04-25 MED ORDER — SODIUM CHLORIDE 0.9 % IV SOLN
600.0000 mg | Freq: Once | INTRAVENOUS | Status: AC
  Administered 2022-04-25: 600 mg via INTRAVENOUS
  Filled 2022-04-25: qty 20

## 2022-04-25 MED ORDER — ACETAMINOPHEN 325 MG PO TABS
650.0000 mg | ORAL_TABLET | Freq: Once | ORAL | Status: AC
  Administered 2022-04-25: 650 mg via ORAL
  Filled 2022-04-25: qty 2

## 2022-04-25 MED ORDER — FAMOTIDINE IN NACL 20-0.9 MG/50ML-% IV SOLN
20.0000 mg | Freq: Once | INTRAVENOUS | Status: AC
  Administered 2022-04-25: 20 mg via INTRAVENOUS
  Filled 2022-04-25: qty 50

## 2022-04-25 MED ORDER — METHYLPREDNISOLONE SODIUM SUCC 125 MG IJ SOLR
125.0000 mg | Freq: Once | INTRAMUSCULAR | Status: AC
  Administered 2022-04-25: 125 mg via INTRAVENOUS
  Filled 2022-04-25: qty 2

## 2022-04-25 NOTE — Progress Notes (Signed)
PATIENT CARE CENTER NOTE     Diagnosis: Multiple sclerosis (HCC) (G35)     Provider: Despina Arias, MD     Procedure: Ocrevus 600 mg infusion     Note: Patient received Ocrevus infusion via PIV. Pre-medications given (Tylenol, Benadryl, Solu-medrol, Pepcid). Infusion titrated per protocol. Patient tolerated infusion well with no adverse reaction. Vital signs stable. Patient declined 1 hour observation post-infusion. AVS offered but patient refused. Patient advised to schedule next infusion for 6 months at the front dest. Patient alert, oriented and ambulatory at discharge.

## 2022-09-06 ENCOUNTER — Telehealth: Payer: Self-pay | Admitting: *Deleted

## 2022-09-06 NOTE — Telephone Encounter (Signed)
We received fax from [place] [facility] that pt insurance requires auth via pharmacy beneift, not medical. Unable to do Buy/bill. Spoke w/ [facility] who confirmed they can take pt insurance.  Provided signed orders by Dr. Jaynee Eagles since Dr. Felecia Shelling out for a couple weeks. Pt next infusion due 10/31/22 (last one 04/25/22).   I called pt and she was agreeable with plan to get her switched to our infusion suite. Aware PA needs to be done, then shipment of med needs to be scheduled w/ [employer] [facility] prior to getting her scheduled. Aware infusion suite will call once all this done to schedule her.

## 2022-10-10 NOTE — Telephone Encounter (Signed)
Received signed consent form back from pt. Faxed appeal letter to [phone]. Marked urgent. Received fax confirmation. Waiting on determination. Notified [facility] that appeal sent.

## 2022-10-10 NOTE — Telephone Encounter (Signed)
Received PA denial letter from [facility]. Appeal letter written, waiting on MD signature.  I called pt. She will need to sign consent form as well for [place] to be able to send appeal. I faxed to her at [phone] and emailed to her at [email or web address]. She will return signed copy.

## 2022-10-11 NOTE — Telephone Encounter (Signed)
[employer]) we have the appeal and will have a determination with in 72 hours.

## 2022-10-16 NOTE — Progress Notes (Deleted)
[facility]  PATIENT: Jennifer Arias DOB: 04-05-1981  REFERRING DOCTOR OR PCP:  Dr. Julio Sicks SOURCE: Patient, notes from primary care, imaging and lab reports, MRI images personally reviewed.  _________________________________   HISTORICAL  CHIEF COMPLAINT:  No chief complaint on file.   HISTORY OF PRESENT ILLNESS:  Jennifer Arias is a 41 y.o. woman with multiple sclerosis followed by Dr. Epimenio Foot. Last seen by Dr. Epimenio Foot 04/17/2022  Update 10/17/2022: She is on Ocrevus and due for her next infusion.   She is tolerating the infusions well.     Currently, she feels stable.    She is walking well but gets foot pain if she walks longer.   She uses the bannister on stairs.  Her left side is weaker than her right side but stronger than last year   She has spasms in her arms, worse at night, that last several minutes.   These are helped by baclofen.  They rarely happen during the day.   She has dysestrhestic pain in her left side, usually mild but sometimes more severe for a short time.     She reports less fatigue the past year but this is still an issue for her.    She needs 9 hours of sleep to function well the next day.    She has some depression and is on fluoxetine. She denies cognitive dysfunction.      MS History: She was diagnosed with MS in 2012 after presenting with numbness in both arms.   She had an MRI and she was diagnosed with MS. I see an MRI from 2014 that showed a focus in the right pons and a punctate focus in the deep white matter of the left frontal lobe. She was initially placed on Aubagio.   She had compliance issues so switched to Ocrevus about 2-3 years ago.   She has been seeing Dr. Renata Caprice in [place], [place] but has just relocated to [facility].     She had an exacerbation with left arm numbness and visual changes while on Aubagio 08/27/2018.   She was switched to on Ocrevus in late 2019  IMAGING STUDIES MRI brain 01/13/2013 showed a T2  hyperintense focus in the right posterior lower pons and 1 punctate focus in the deep white matter of the left frontal lobe. These did not enhance.   MRI brain/orbits 08/27/2018 showed a small enhancing focus at the left pontomedullary junction and small T2 hyperintense foci in the left occipital periventricular white matter, deep white matter of the left frontal lobe and deep white matter of the left parietal lobe. The focus seen in 2014 is not apparent on this MRI.  MRI cervical spine 07/27/2020 showed a small midline disc herniation at C3-4 associated with mild spinal stenosis towards the right and mild right foraminal narrowing but no nerve root compression.. Severe foraminal stenosis C5-6 on the right that could compress the right C6 nerve root. No spinal cord lesion.    MRI brain 07/27/2020 showed a small  T2 hyperintensity in the left occipital periventricular white matter and another tiny punctate focus of T2 hyperintensity in the left frontal subcortical white matter and another in the deep left parietal white matter. No foci in the brainstem were noted on this MRI.Marland Kitchen No pathologic enhancement.There is a partially empty sella  Labs:  PPD 06/05/2020 [facility]) was negative.   HepB sAg and HepBcAb IgM were negative 07/29/20  She did the Covid vaccination July 2021.  REVIEW OF  SYSTEMS: Constitutional: No fevers, chills, sweats, or change in appetite Eyes: No visual changes, double vision, eye pain Ear, nose and throat: No hearing loss, ear pain, nasal congestion, sore throat Cardiovascular: No chest pain, palpitations Respiratory:  No shortness of breath at rest or with exertion.   No wheezes GastrointestinaI: No nausea, vomiting, diarrhea, abdominal pain, fecal incontinence Genitourinary:  No dysuria, urinary retention or frequency.  No nocturia. Musculoskeletal:  No neck pain, back pain Integumentary: No rash, pruritus, skin lesions Neurological: as above Psychiatric: No depression at this  time.  No anxiety Endocrine: No palpitations, diaphoresis, change in appetite, change in weigh or increased thirst Hematologic/Lymphatic:  No anemia, purpura, petechiae. Allergic/Immunologic: No itchy/runny eyes, nasal congestion, recent allergic reactions, rashes  ALLERGIES: Allergies  Allergen Reactions   Sulfa Antibiotics Hives, Itching and Rash    HOME MEDICATIONS:  Current Outpatient Medications:    baclofen (LIORESAL) 10 MG tablet, One to two po qHS, Disp: 60 each, Rfl: 5   FLUoxetine (PROZAC) 40 MG capsule, Take 40 mg by mouth daily. , Disp: , Rfl:    fluticasone (FLONASE) 50 MCG/ACT nasal spray, Place 1-2 sprays into both nostrils daily as needed for allergies or rhinitis., Disp: , Rfl:    Ocrelizumab (OCREVUS IV), Inject into the vein., Disp: , Rfl:   Current Facility-Administered Medications:    acetaminophen (TYLENOL) tablet 650 mg, 650 mg, Oral, Once, Sater, Richard A, MD   diphenhydrAMINE (BENADRYL) injection 50 mg, 50 mg, Intravenous, Once, Sater, Richard A, MD   famotidine (PEPCID) 20 mg in sodium chloride 0.9 % 50 mL IVPB, 20 mg, Intravenous, Once, Sater, Richard A, MD   methylPREDNISolone sodium succinate (SOLU-MEDROL) 125 mg in sodium chloride 0.9 % 50 mL IVPB, 125 mg, Intravenous, Once, Sater, Richard A, MD   ocrelizumab (OCREVUS) 600 mg in sodium chloride 0.9 % 250 mL, 600 mg, Intravenous, Once, Sater, Pearletha Furl, MD  PAST MEDICAL HISTORY: Past Medical History:  Diagnosis Date   Anemia    "in my younger years; improved after hysterectomy"   (08/27/2018)   Anxiety    Depression    Essential hypertension    MS (multiple sclerosis) (HCC)    Obesity     PAST SURGICAL HISTORY: Past Surgical History:  Procedure Laterality Date   VAGINAL HYSTERECTOMY      FAMILY HISTORY: Family History  Problem Relation Age of Onset   Multiple sclerosis Father     SOCIAL HISTORY:  Social History   Socioeconomic History   Marital status: Single    Spouse name: Not on  file   Number of children: 2   Years of education: 12   Highest education level: Not on file  Occupational History   Occupation: various part-time jobs  Tobacco Use   Smoking status: Never   Smokeless tobacco: Never  Vaping Use   Vaping Use: Never used  Substance and Sexual Activity   Alcohol use: Not Currently    Alcohol/week: 2.0 standard drinks of alcohol    Types: 2 Standard drinks or equivalent per week   Drug use: Yes    Types: Marijuana    Comment: 08/27/2018 "qd"   Sexual activity: Yes  Other Topics Concern   Not on file  Social History Narrative   Right handed      Caffeine use: none   Social Determinants of Health   Financial Resource Strain: Not on file  Food Insecurity: Not on file  Transportation Needs: Not on file  Physical Activity: Not on file  Stress: Not on file  Social Connections: Not on file  Intimate Partner Violence: Not on file     PHYSICAL EXAM  There were no vitals filed for this visit.    There is no height or weight on file to calculate BMI.  No results found.   General: The patient is well-developed and well-nourished and in no acute distress  Skin: Extremities are without rash or  edema.  Musculoskeletal:  Back is nontender  Neurologic Exam  Mental status: The patient is alert and oriented x 3 at the time of the examination. The patient has apparent normal recent and remote memory, with an apparently normal attention span and concentration ability.   Speech is normal.  Cranial nerves: Extraocular movements are full. Pupils are equal, round, and reactive to light and accomodation.  Color vision is mildly reduced on the right.  Facial strength and sensation were normal.. No obvious hearing deficits are noted.  Motor:  Muscle bulk is normal.   Tone is normal. Strength is  5 / 5 in all 4 extremities.   Sensory: Sensory testing is intact to touch and vibration on today's exam.  Coordination: Cerebellar testing reveals good  finger-nose-finger and heel-to-shin bilaterally.  Gait and station: Station is normal.   Gait is normal. Tandem gait is fairly normal. Romberg is negative.   Reflexes: Deep tendon reflexes are symmetric and increased bilaterally, 3 in arms, 3+ at knees, no ankle clonus.     DIAGNOSTIC DATA (LABS, IMAGING, TESTING) - I reviewed patient records, labs, notes, testing and imaging myself where available.  Lab Results  Component Value Date   WBC 7.5 04/17/2022   HGB 13.1 04/17/2022   HCT 37.9 04/17/2022   MCV 93 04/17/2022   PLT 232 04/17/2022      Component Value Date/Time   NA 142 07/26/2020 2230   K 3.8 07/26/2020 2230   CL 105 07/26/2020 2230   CO2 23 07/26/2020 2156   GLUCOSE 90 07/26/2020 2230   BUN 11 07/26/2020 2230   CREATININE 1.00 07/26/2020 2230   CALCIUM 9.1 07/26/2020 2156   PROT 6.2 (L) 07/26/2020 2156   ALBUMIN 3.6 07/26/2020 2156   AST 18 07/26/2020 2156   ALT 16 07/26/2020 2156   ALKPHOS 57 07/26/2020 2156   BILITOT 0.4 07/26/2020 2156   GFRNONAA >60 07/26/2020 2156   GFRAA >60 07/26/2020 2156       ASSESSMENT AND PLAN  No diagnosis found.   Continue Ocrevus.  We will check lab work today.   Repeat MRI brain to evaluate for any progression Stay active and exercise as tolerated. Continue baclofen for spasms that occur mostly at night..  We discussed that if the dysesthesias worsen I would consider adding a medication (gabapentin). Return in 6 months or sooner if there are new or worsening neurologic symptoms.     I spent *** minutes of face-to-face and non-face-to-face time with patient.  This included previsit chart review, lab review, study review, order entry, electronic health record documentation, patient education  Ihor Austin, [facility]  [facility] [address] [place], [place] [address]  Phone [phone] Fax [phone] Note: This document was prepared with digital dictation and possible smart  phrase technology. Any transcriptional errors that result from this process are unintentional.

## 2022-10-16 NOTE — Telephone Encounter (Signed)
Called [facility] community plan at [phone] to check status of appeal. Spoke w/ Nehemiah Settle.  Denial overturned and approved 10/11/2022-10/12/2023. She will fax approval letter to [place] at [phone].

## 2022-10-17 ENCOUNTER — Ambulatory Visit: Payer: Medicaid Other | Admitting: Adult Health

## 2022-10-24 ENCOUNTER — Other Ambulatory Visit: Payer: Self-pay | Admitting: *Deleted

## 2022-10-24 DIAGNOSIS — G35 Multiple sclerosis: Secondary | ICD-10-CM

## 2022-10-31 ENCOUNTER — Non-Acute Institutional Stay (HOSPITAL_COMMUNITY)
Admission: RE | Admit: 2022-10-31 | Discharge: 2022-10-31 | Disposition: A | Discharge status: Home / Self Care | Payer: Medicaid Other | Source: Ambulatory Visit | Attending: Internal Medicine | Admitting: Internal Medicine

## 2022-10-31 DIAGNOSIS — G35 Multiple sclerosis: Secondary | ICD-10-CM | POA: Insufficient documentation

## 2022-10-31 MED ORDER — SODIUM CHLORIDE 0.9 % IV SOLN: INTRAVENOUS | Status: DC | PRN

## 2022-10-31 MED ORDER — ACETAMINOPHEN 325 MG PO TABS
650.0000 mg | ORAL_TABLET | Freq: Once | ORAL | Status: AC
  Administered 2022-10-31: 650 mg via ORAL
  Filled 2022-10-31: qty 2

## 2022-10-31 MED ORDER — FAMOTIDINE IN NACL 20-0.9 MG/50ML-% IV SOLN
20.0000 mg | Freq: Once | INTRAVENOUS | Status: AC
  Administered 2022-10-31: 20 mg via INTRAVENOUS
  Filled 2022-10-31: qty 50

## 2022-10-31 MED ORDER — METHYLPREDNISOLONE SODIUM SUCC 125 MG IJ SOLR
125.0000 mg | Freq: Once | INTRAMUSCULAR | Status: AC
  Administered 2022-10-31: 125 mg via INTRAVENOUS
  Filled 2022-10-31: qty 2

## 2022-10-31 MED ORDER — DIPHENHYDRAMINE HCL 50 MG/ML IJ SOLN
50.0000 mg | Freq: Once | INTRAMUSCULAR | Status: AC
  Administered 2022-10-31: 50 mg via INTRAVENOUS
  Filled 2022-10-31: qty 1

## 2022-10-31 MED ORDER — SODIUM CHLORIDE 0.9 % IV SOLN
600.0000 mg | Freq: Once | INTRAVENOUS | Status: AC
  Administered 2022-10-31: 600 mg via INTRAVENOUS
  Filled 2022-10-31: qty 20

## 2022-10-31 NOTE — Progress Notes (Signed)
PATIENT CARE CENTER NOTE:  Provider: Despina Arias MD  Diagnosis: Multiple Sclerosis ( HCC) ( G35)   Procedure: Ocrevus 600mg  infusion   Patient received IV Ocrevus. Pre infusion medications - Benadryl IV, IV Solu-medrol, Pepcid IV, and Tylenol PO were given per orders. Medication was titrated per protocol. After infusion completed, pt had a BP 149/93, pt states she feels fine , is having some stressors at home and occasionally her BP will elevate. RN notified by secure chat.  Patient declined to wait for the 60 minutes post infusion observation. Tolerated well, vitals stable, discharge instructions given, verbalized understanding. Pt to RTC in 6 months, verbalized understanding. Patient alert, oriented and ambulatory at the time of discharge

## 2022-11-05 NOTE — Progress Notes (Unsigned)
[facility]  PATIENT: Jennifer Arias DOB: 11-10-1981  REFERRING DOCTOR OR PCP:  Dr. Vista Lawman SOURCE: Patient, notes from primary care, imaging and lab reports, MRI images personally reviewed.  _________________________________   HISTORICAL  CHIEF COMPLAINT:  No chief complaint on file.   HISTORY OF PRESENT ILLNESS:  Jennifer Arias is a 41 y.o. woman with multiple sclerosis followed by Dr. Felecia Shelling. Last seen by Dr. Felecia Shelling 04/17/2022  Update 11/05/2022: She is on Ocrevus, tolerating the infusions well.  Last infusion 10/31/2022  Currently, she feels stable.    She is walking well but gets foot pain if she walks longer.   She uses the bannister on stairs.  Her left side is weaker than her right side but stronger than last year   She has spasms in her arms, worse at night, that last several minutes.   These are helped by baclofen.  They rarely happen during the day.   She has dysestrhestic pain in her left side, usually mild but sometimes more severe for a short time.     She reports less fatigue the past year but this is still an issue for her.    She needs 9 hours of sleep to function well the next day.    She has some depression and is on fluoxetine. She denies cognitive dysfunction.      MS History: She was diagnosed with MS in 2012 after presenting with numbness in both arms.   She had an MRI and she was diagnosed with MS. I see an MRI from 2014 that showed a focus in the right pons and a punctate focus in the deep white matter of the left frontal lobe. She was initially placed on Aubagio.   She had compliance issues so switched to Ocrevus about 2-3 years ago.   She has been seeing Dr. Heloise Purpura in [place], [place] but has just relocated to [facility].     She had an exacerbation with left arm numbness and visual changes while on Aubagio 08/27/2018.   She was switched to on Ocrevus in late 2019  IMAGING STUDIES MRI brain 01/13/2013 showed a T2 hyperintense focus in the  right posterior lower pons and 1 punctate focus in the deep white matter of the left frontal lobe. These did not enhance.   MRI brain/orbits 08/27/2018 showed a small enhancing focus at the left pontomedullary junction and small T2 hyperintense foci in the left occipital periventricular white matter, deep white matter of the left frontal lobe and deep white matter of the left parietal lobe. The focus seen in 2014 is not apparent on this MRI.  MRI cervical spine 07/27/2020 showed a small midline disc herniation at C3-4 associated with mild spinal stenosis towards the right and mild right foraminal narrowing but no nerve root compression.. Severe foraminal stenosis C5-6 on the right that could compress the right C6 nerve root. No spinal cord lesion.    MRI brain 07/27/2020 showed a small  T2 hyperintensity in the left occipital periventricular white matter and another tiny punctate focus of T2 hyperintensity in the left frontal subcortical white matter and another in the deep left parietal white matter. No foci in the brainstem were noted on this MRI.Marland Kitchen No pathologic enhancement.There is a partially empty sella  Labs:  PPD 06/05/2020 [facility]) was negative.   HepB sAg and HepBcAb IgM were negative 07/29/20  She did the Covid vaccination July 2021.  REVIEW OF SYSTEMS: Constitutional: No fevers, chills, sweats, or change in  appetite Eyes: No visual changes, double vision, eye pain Ear, nose and throat: No hearing loss, ear pain, nasal congestion, sore throat Cardiovascular: No chest pain, palpitations Respiratory:  No shortness of breath at rest or with exertion.   No wheezes GastrointestinaI: No nausea, vomiting, diarrhea, abdominal pain, fecal incontinence Genitourinary:  No dysuria, urinary retention or frequency.  No nocturia. Musculoskeletal:  No neck pain, back pain Integumentary: No rash, pruritus, skin lesions Neurological: as above Psychiatric: No depression at this time.  No  anxiety Endocrine: No palpitations, diaphoresis, change in appetite, change in weigh or increased thirst Hematologic/Lymphatic:  No anemia, purpura, petechiae. Allergic/Immunologic: No itchy/runny eyes, nasal congestion, recent allergic reactions, rashes  ALLERGIES: Allergies  Allergen Reactions   Sulfa Antibiotics Hives, Itching and Rash    HOME MEDICATIONS:  Current Outpatient Medications:    baclofen (LIORESAL) 10 MG tablet, One to two po qHS, Disp: 60 each, Rfl: 5   FLUoxetine (PROZAC) 40 MG capsule, Take 40 mg by mouth daily. , Disp: , Rfl:    fluticasone (FLONASE) 50 MCG/ACT nasal spray, Place 1-2 sprays into both nostrils daily as needed for allergies or rhinitis., Disp: , Rfl:    Ocrelizumab (OCREVUS IV), Inject into the vein., Disp: , Rfl:   Current Facility-Administered Medications:    acetaminophen (TYLENOL) tablet 650 mg, 650 mg, Oral, Once, Sater, Richard A, MD   diphenhydrAMINE (BENADRYL) injection 50 mg, 50 mg, Intravenous, Once, Sater, Richard A, MD   famotidine (PEPCID) 20 mg in sodium chloride 0.9 % 50 mL IVPB, 20 mg, Intravenous, Once, Sater, Richard A, MD   methylPREDNISolone sodium succinate (SOLU-MEDROL) 125 mg in sodium chloride 0.9 % 50 mL IVPB, 125 mg, Intravenous, Once, Sater, Richard A, MD   ocrelizumab (OCREVUS) 600 mg in sodium chloride 0.9 % 250 mL, 600 mg, Intravenous, Once, Sater, Pearletha Furl, MD  PAST MEDICAL HISTORY: Past Medical History:  Diagnosis Date   Anemia    "in my younger years; improved after hysterectomy"   (08/27/2018)   Anxiety    Depression    Essential hypertension    MS (multiple sclerosis) (HCC)    Obesity     PAST SURGICAL HISTORY: Past Surgical History:  Procedure Laterality Date   VAGINAL HYSTERECTOMY      FAMILY HISTORY: Family History  Problem Relation Age of Onset   Multiple sclerosis Father     SOCIAL HISTORY:  Social History   Socioeconomic History   Marital status: Single    Spouse name: Not on file    Number of children: 2   Years of education: 12   Highest education level: Not on file  Occupational History   Occupation: various part-time jobs  Tobacco Use   Smoking status: Never   Smokeless tobacco: Never  Vaping Use   Vaping Use: Never used  Substance and Sexual Activity   Alcohol use: Not Currently    Alcohol/week: 2.0 standard drinks of alcohol    Types: 2 Standard drinks or equivalent per week   Drug use: Yes    Types: Marijuana    Comment: 08/27/2018 "qd"   Sexual activity: Yes  Other Topics Concern   Not on file  Social History Narrative   Right handed      Caffeine use: none   Social Determinants of Health   Financial Resource Strain: Not on file  Food Insecurity: Not on file  Transportation Needs: Not on file  Physical Activity: Not on file  Stress: Not on file  Social Connections: Not on  file  Intimate Partner Violence: Not on file     PHYSICAL EXAM  There were no vitals filed for this visit.    There is no height or weight on file to calculate BMI.  No results found.   General: The patient is well-developed and well-nourished and in no acute distress  Skin: Extremities are without rash or  edema.  Musculoskeletal:  Back is nontender  Neurologic Exam  Mental status: The patient is alert and oriented x 3 at the time of the examination. The patient has apparent normal recent and remote memory, with an apparently normal attention span and concentration ability.   Speech is normal.  Cranial nerves: Extraocular movements are full. Pupils are equal, round, and reactive to light and accomodation.  Color vision is mildly reduced on the right.  Facial strength and sensation were normal.. No obvious hearing deficits are noted.  Motor:  Muscle bulk is normal.   Tone is normal. Strength is  5 / 5 in all 4 extremities.   Sensory: Sensory testing is intact to touch and vibration on today's exam.  Coordination: Cerebellar testing reveals good  finger-nose-finger and heel-to-shin bilaterally.  Gait and station: Station is normal.   Gait is normal. Tandem gait is fairly normal. Romberg is negative.   Reflexes: Deep tendon reflexes are symmetric and increased bilaterally, 3 in arms, 3+ at knees, no ankle clonus.     DIAGNOSTIC DATA (LABS, IMAGING, TESTING) - I reviewed patient records, labs, notes, testing and imaging myself where available.  Lab Results  Component Value Date   WBC 7.5 04/17/2022   HGB 13.1 04/17/2022   HCT 37.9 04/17/2022   MCV 93 04/17/2022   PLT 232 04/17/2022      Component Value Date/Time   NA 142 07/26/2020 2230   K 3.8 07/26/2020 2230   CL 105 07/26/2020 2230   CO2 23 07/26/2020 2156   GLUCOSE 90 07/26/2020 2230   BUN 11 07/26/2020 2230   CREATININE 1.00 07/26/2020 2230   CALCIUM 9.1 07/26/2020 2156   PROT 6.2 (L) 07/26/2020 2156   ALBUMIN 3.6 07/26/2020 2156   AST 18 07/26/2020 2156   ALT 16 07/26/2020 2156   ALKPHOS 57 07/26/2020 2156   BILITOT 0.4 07/26/2020 2156   GFRNONAA >60 07/26/2020 2156   GFRAA >60 07/26/2020 2156       ASSESSMENT AND PLAN  No diagnosis found.   Continue Ocrevus.  We will check lab work today.   Repeat MRI brain to evaluate for any progression Stay active and exercise as tolerated. Continue baclofen for spasms that occur mostly at night..  We discussed that if the dysesthesias worsen I would consider adding a medication (gabapentin). Return in 6 months or sooner if there are new or worsening neurologic symptoms.     I spent *** minutes of face-to-face and non-face-to-face time with patient.  This included previsit chart review, lab review, study review, order entry, electronic health record documentation, patient education  Frann Rider, [facility]  [facility] [address] [place], [place] [address]  Phone [phone] Fax [phone] Note: This document was prepared with digital dictation and possible smart  phrase technology. Any transcriptional errors that result from this process are unintentional.

## 2022-11-06 ENCOUNTER — Encounter: Payer: Self-pay | Admitting: Adult Health

## 2022-11-06 ENCOUNTER — Ambulatory Visit: Payer: Medicaid Other | Admitting: Adult Health

## 2022-11-06 VITALS — BP 145/93 | HR 89 | Ht 68.0 in | Wt 275.0 lb

## 2022-11-06 DIAGNOSIS — G35 Multiple sclerosis: Secondary | ICD-10-CM | POA: Diagnosis not present

## 2022-11-06 DIAGNOSIS — Z79899 Other long term (current) drug therapy: Secondary | ICD-10-CM

## 2022-11-06 MED ORDER — BACLOFEN 10 MG PO TABS: ORAL_TABLET | ORAL | 5 refills | Status: DC

## 2022-11-06 NOTE — Patient Instructions (Signed)
No changes today  Continue Ocrevus infusions as scheduled  We will check lab work today  You will be contacted to schedule MRI brain and cervical spine to evaluate for any progression  Continue baclofen as needed for spasms    Follow-up in 6 months with Dr. Epimenio Foot or call earlier

## 2022-11-07 LAB — CBC WITH DIFFERENTIAL/PLATELET
Basophils Absolute: 0 10*3/uL (ref 0.0–0.2)
Basos: 1 %
EOS (ABSOLUTE): 0.1 10*3/uL (ref 0.0–0.4)
Eos: 1 %
Hematocrit: 38.4 % (ref 34.0–46.6)
Hemoglobin: 12.7 g/dL (ref 11.1–15.9)
Immature Grans (Abs): 0 10*3/uL (ref 0.0–0.1)
Immature Granulocytes: 0 %
Lymphocytes Absolute: 1.5 10*3/uL (ref 0.7–3.1)
Lymphs: 22 %
MCH: 31.1 pg (ref 26.6–33.0)
MCHC: 33.1 g/dL (ref 31.5–35.7)
MCV: 94 fL (ref 79–97)
Monocytes Absolute: 0.6 10*3/uL (ref 0.1–0.9)
Monocytes: 9 %
Neutrophils Absolute: 4.7 10*3/uL (ref 1.4–7.0)
Neutrophils: 67 %
Platelets: 260 10*3/uL (ref 150–450)
RBC: 4.08 x10E6/uL (ref 3.77–5.28)
RDW: 12 % (ref 11.7–15.4)
WBC: 6.9 10*3/uL (ref 3.4–10.8)

## 2022-11-07 LAB — IGG, IGA, IGM
IgA/Immunoglobulin A, Serum: 179 mg/dL (ref 87–352)
IgG (Immunoglobin G), Serum: 819 mg/dL (ref 586–1602)
IgM (Immunoglobulin M), Srm: 45 mg/dL (ref 26–217)

## 2022-11-12 ENCOUNTER — Telehealth: Payer: Self-pay | Admitting: Adult Health

## 2022-11-12 NOTE — Telephone Encounter (Signed)
Sent to [employer], [facility] Medicaid auths: [id number] exp. 11/06/22-12/21/22 & [id number] exp. 11/08/22-12/23/22.

## 2022-12-05 ENCOUNTER — Other Ambulatory Visit: Payer: Medicaid Other

## 2022-12-27 ENCOUNTER — Other Ambulatory Visit: Payer: Self-pay | Admitting: Physician Assistant

## 2022-12-27 DIAGNOSIS — Z1231 Encounter for screening mammogram for malignant neoplasm of breast: Secondary | ICD-10-CM

## 2023-01-09 ENCOUNTER — Ambulatory Visit: Payer: Medicaid Other

## 2023-01-21 ENCOUNTER — Ambulatory Visit
Admission: RE | Admit: 2023-01-21 | Discharge: 2023-01-21 | Disposition: A | Discharge status: Home / Self Care | Payer: Medicaid Other | Source: Ambulatory Visit | Attending: Physician Assistant | Admitting: Physician Assistant

## 2023-01-21 DIAGNOSIS — Z1231 Encounter for screening mammogram for malignant neoplasm of breast: Secondary | ICD-10-CM

## 2023-04-01 ENCOUNTER — Other Ambulatory Visit: Payer: Self-pay

## 2023-04-01 DIAGNOSIS — G35 Multiple sclerosis: Secondary | ICD-10-CM

## 2023-04-01 MED ORDER — BACLOFEN 10 MG PO TABS: ORAL_TABLET | ORAL | 0 refills | Status: AC

## 2023-05-01 ENCOUNTER — Encounter (HOSPITAL_COMMUNITY): Payer: Medicaid Other

## 2023-07-18 ENCOUNTER — Telehealth: Payer: Self-pay | Admitting: *Deleted

## 2023-07-18 ENCOUNTER — Telehealth: Payer: Self-pay

## 2023-07-18 NOTE — Telephone Encounter (Signed)
Faxed ocrevus start form to [phone]

## 2023-07-18 NOTE — Telephone Encounter (Signed)
Patient has MS and did not schedule a 6 month follow up from visit on 11/06/22. Please call to schedule patient a follow up visit.

## 2023-07-29 ENCOUNTER — Encounter: Payer: Self-pay | Admitting: Adult Health

## 2023-07-29 ENCOUNTER — Ambulatory Visit: Payer: Medicaid Other | Admitting: Adult Health

## 2023-07-29 VITALS — BP 140/91 | HR 83 | Ht 69.0 in | Wt 279.0 lb

## 2023-07-29 DIAGNOSIS — M79601 Pain in right arm: Secondary | ICD-10-CM

## 2023-07-29 DIAGNOSIS — R519 Headache, unspecified: Secondary | ICD-10-CM

## 2023-07-29 DIAGNOSIS — G35 Multiple sclerosis: Secondary | ICD-10-CM

## 2023-07-29 DIAGNOSIS — Z79899 Other long term (current) drug therapy: Secondary | ICD-10-CM | POA: Diagnosis not present

## 2023-07-29 NOTE — Progress Notes (Unsigned)
[facility]  PATIENT: Jennifer Arias DOB: 01-Feb-1981  REFERRING DOCTOR OR PCP:  Dr. Julio Sicks SOURCE: Patient, notes from primary care, imaging and lab reports, MRI images personally reviewed.  _________________________________   HISTORICAL  CHIEF COMPLAINT:  No chief complaint on file.   HISTORY OF PRESENT ILLNESS:  Jennifer Arias is a 42 y.o. woman with multiple sclerosis followed by Dr. Epimenio Foot. Last seen on 11/06/2022  Update 07/29/2023: She is on Ocrevus, tolerating the infusions well.  Last infusion ***. Last MRI stable 07/2020. Ordered imaging at prior visit but not yet completed.    Currently, she feels stable. No new or exacerbating symptoms. Gait has been stable. Denies any recent falls. Occasional muscle spasms in her arms, will use baclofen about 1-2x per month (improved since prior visit). Mood has been good. Denies changes in vision or bowel/bladder. 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    MS History: She was diagnosed with MS in 2012 after presenting with numbness in both arms.   She had an MRI and she was diagnosed with MS. I see an MRI from 2014 that showed a focus in the right pons and a punctate focus in the deep white matter of the left frontal lobe. She was initially placed on Aubagio.   She had compliance issues so switched to Ocrevus about 2-3 years ago.   She has been seeing Dr. Renata Caprice in [place], [place] but has just relocated to [facility].     She had an exacerbation with left arm numbness and visual changes while on Aubagio 08/27/2018.   She was switched to on Ocrevus in late 2019  IMAGING STUDIES MRI brain 01/13/2013 showed a T2 hyperintense focus in the right posterior lower pons and 1 punctate focus in the deep white matter of the left frontal lobe. These did not enhance.   MRI brain/orbits 08/27/2018 showed a small enhancing focus at the left pontomedullary junction and small T2 hyperintense foci in the left  occipital periventricular white matter, deep white matter of the left frontal lobe and deep white matter of the left parietal lobe. The focus seen in 2014 is not apparent on this MRI.  MRI cervical spine 07/27/2020 showed a small midline disc herniation at C3-4 associated with mild spinal stenosis towards the right and mild right foraminal narrowing but no nerve root compression.. Severe foraminal stenosis C5-6 on the right that could compress the right C6 nerve root. No spinal cord lesion.    MRI brain 07/27/2020 showed a small  T2 hyperintensity in the left occipital periventricular white matter and another tiny punctate focus of T2 hyperintensity in the left frontal subcortical white matter and another in the deep left parietal white matter. No foci in the brainstem were noted on this MRI.Marland Kitchen No pathologic enhancement.There is a partially empty sella  Labs:  PPD 06/05/2020 [facility]) was negative.   HepB sAg and HepBcAb IgM were negative 07/29/20  She did the Covid vaccination July 2021.  REVIEW OF SYSTEMS: Constitutional: No fevers, chills, sweats, or change in appetite Eyes: No visual changes, double vision, eye pain Ear, nose and throat: No hearing loss, ear pain, nasal congestion, sore throat Cardiovascular: No chest pain, palpitations Respiratory:  No shortness of breath at rest or with exertion.   No wheezes GastrointestinaI: No nausea, vomiting, diarrhea, abdominal pain, fecal incontinence Genitourinary:  No dysuria, urinary retention or frequency.  No nocturia. Musculoskeletal:  No neck pain, back pain Integumentary: No rash, pruritus, skin lesions  Neurological: as above Psychiatric: No depression at this time.  No anxiety Endocrine: No palpitations, diaphoresis, change in appetite, change in weigh or increased thirst Hematologic/Lymphatic:  No anemia, purpura, petechiae. Allergic/Immunologic: No itchy/runny eyes, nasal congestion, recent allergic reactions,  rashes  ALLERGIES: Allergies  Allergen Reactions   Sulfa Antibiotics Hives, Itching and Rash    HOME MEDICATIONS:  Current Outpatient Medications:    baclofen (LIORESAL) 10 MG tablet, One to two po qHS, Disp: 60 each, Rfl: 0   FLUoxetine (PROZAC) 40 MG capsule, Take 40 mg by mouth daily. , Disp: , Rfl:    fluticasone (FLONASE) 50 MCG/ACT nasal spray, Place 1-2 sprays into both nostrils daily as needed for allergies or rhinitis., Disp: , Rfl:    Ocrelizumab (OCREVUS IV), Inject into the vein., Disp: , Rfl:   Current Facility-Administered Medications:    acetaminophen (TYLENOL) tablet 650 mg, 650 mg, Oral, Once, Sater, Richard A, MD   diphenhydrAMINE (BENADRYL) injection 50 mg, 50 mg, Intravenous, Once, Sater, Richard A, MD   famotidine (PEPCID) 20 mg in sodium chloride 0.9 % 50 mL IVPB, 20 mg, Intravenous, Once, Sater, Richard A, MD   methylPREDNISolone sodium succinate (SOLU-MEDROL) 125 mg in sodium chloride 0.9 % 50 mL IVPB, 125 mg, Intravenous, Once, Sater, Richard A, MD   ocrelizumab (OCREVUS) 600 mg in sodium chloride 0.9 % 250 mL, 600 mg, Intravenous, Once, Sater, Pearletha Furl, MD  PAST MEDICAL HISTORY: Past Medical History:  Diagnosis Date   Anemia    "in my younger years; improved after hysterectomy"   (08/27/2018)   Anxiety    Depression    Essential hypertension    MS (multiple sclerosis) (HCC)    Obesity     PAST SURGICAL HISTORY: Past Surgical History:  Procedure Laterality Date   VAGINAL HYSTERECTOMY      FAMILY HISTORY: Family History  Problem Relation Age of Onset   Multiple sclerosis Father     SOCIAL HISTORY:  Social History   Socioeconomic History   Marital status: Single    Spouse name: Not on file   Number of children: 2   Years of education: 12   Highest education level: Not on file  Occupational History   Occupation: various part-time jobs  Tobacco Use   Smoking status: Never   Smokeless tobacco: Never  Vaping Use   Vaping status: Never  Used  Substance and Sexual Activity   Alcohol use: Not Currently    Alcohol/week: 2.0 standard drinks of alcohol    Types: 2 Standard drinks or equivalent per week   Drug use: Yes    Types: Marijuana    Comment: 08/27/2018 "qd"   Sexual activity: Yes  Other Topics Concern   Not on file  Social History Narrative   Right handed      Caffeine use: none   Social Determinants of Health   Financial Resource Strain: Not on File (10/23/2018)   Received from [employer], [employer]    Financial Resource Strain: 0  Food Insecurity: Not on File (10/23/2018)   Received from [place], [place]   Food Insecurity    Food: 0  Transportation Needs: Not on File (10/23/2018)   Received from [place], [employer] Needs    Transportation: 0  Physical Activity: Not on File (10/23/2018)   Received from [place], [place]   Physical Activity    Physical Activity: 0  Stress: Not on File (10/23/2018)   Received from [place], [place]   Stress  Stress: 0  Social Connections: Unknown (04/01/2022)   Received from [facility]   Social Network    Social Network: Not on file  Intimate Partner Violence: Unknown (03/08/2022)   Received from [facility]   HITS    Physically Hurt: Not on file    Insult or Talk Down To: Not on file    Threaten Physical Harm: Not on file    Scream or Curse: Not on file     PHYSICAL EXAM  There were no vitals filed for this visit.     There is no height or weight on file to calculate BMI.  No results found.   General: The patient is well-developed and well-nourished and in no acute distress  Skin: Extremities are without rash or  edema.  Musculoskeletal:  Back is nontender  Neurologic Exam  Mental status: The patient is alert and oriented x 3 at the time of the examination. The patient has apparent normal recent and remote memory, with an apparently normal attention span and concentration ability.   Speech is normal.  Cranial nerves:  Extraocular movements are full. Pupils are equal, round, and reactive to light and accomodation.  Color vision is mildly reduced on the right.  Facial strength and sensation were normal.. No obvious hearing deficits are noted.  Motor:  Muscle bulk is normal.   Tone is normal. Strength is  5 / 5 in all 4 extremities.   Sensory: Sensory testing is intact to touch and vibration on today's exam.  Coordination: Cerebellar testing reveals good finger-nose-finger and heel-to-shin bilaterally.  Gait and station: Station is normal.   Gait is normal. Tandem gait is fairly normal. Romberg is negative.   Reflexes: Deep tendon reflexes are symmetric and increased bilaterally, 3 in arms, 3+ at knees, no ankle clonus.     DIAGNOSTIC DATA (LABS, IMAGING, TESTING) - I reviewed patient records, labs, notes, testing and imaging myself where available.  Lab Results  Component Value Date   WBC 6.9 11/06/2022   HGB 12.7 11/06/2022   HCT 38.4 11/06/2022   MCV 94 11/06/2022   PLT 260 11/06/2022      Component Value Date/Time   NA 142 07/26/2020 2230   K 3.8 07/26/2020 2230   CL 105 07/26/2020 2230   CO2 23 07/26/2020 2156   GLUCOSE 90 07/26/2020 2230   BUN 11 07/26/2020 2230   CREATININE 1.00 07/26/2020 2230   CALCIUM 9.1 07/26/2020 2156   PROT 6.2 (L) 07/26/2020 2156   ALBUMIN 3.6 07/26/2020 2156   AST 18 07/26/2020 2156   ALT 16 07/26/2020 2156   ALKPHOS 57 07/26/2020 2156   BILITOT 0.4 07/26/2020 2156   GFRNONAA >60 07/26/2020 2156   GFRAA >60 07/26/2020 2156       ASSESSMENT AND PLAN  No diagnosis found.   Continue Ocrevus.  We will check lab work today.   Repeat MRI brain and cervical spine to evaluate for any progression Stay active and exercise as tolerated. Continue baclofen for spasms that occur mostly at night..   Return in 6 months or sooner if there are new or worsening neurologic symptoms.     I spent 21 minutes of face-to-face and non-face-to-face time with patient.   This included previsit chart review, lab review, study review, order entry, electronic health record documentation, patient education and discussion regarding above diagnoses and treatment plan and answered all the questions to patient's satisfaction  Ihor Austin, AGNP-BC  [facility] [address] [place],  [place] [address]  Phone [phone] Fax [phone] Note: This document was prepared with digital dictation and possible smart phrase technology. Any transcriptional errors that result from this process are unintentional.

## 2023-07-30 ENCOUNTER — Encounter: Payer: Self-pay | Admitting: Adult Health

## 2023-07-31 ENCOUNTER — Other Ambulatory Visit (INDEPENDENT_AMBULATORY_CARE_PROVIDER_SITE_OTHER): Payer: Self-pay

## 2023-07-31 DIAGNOSIS — Z0289 Encounter for other administrative examinations: Secondary | ICD-10-CM

## 2023-08-29 ENCOUNTER — Ambulatory Visit
Admission: RE | Admit: 2023-08-29 | Discharge: 2023-08-29 | Disposition: A | Discharge status: Home / Self Care | Payer: Medicaid Other | Source: Ambulatory Visit | Attending: Adult Health | Admitting: Adult Health

## 2023-08-29 ENCOUNTER — Encounter: Payer: Self-pay | Admitting: Internal Medicine

## 2023-08-29 DIAGNOSIS — G35 Multiple sclerosis: Secondary | ICD-10-CM

## 2023-08-29 MED ORDER — GADOPICLENOL 0.5 MMOL/ML IV SOLN
10.0000 mL | Freq: Once | INTRAVENOUS | Status: AC | PRN
  Administered 2023-08-29: 10 mL via INTRAVENOUS

## 2023-10-16 ENCOUNTER — Other Ambulatory Visit (INDEPENDENT_AMBULATORY_CARE_PROVIDER_SITE_OTHER): Payer: Self-pay

## 2023-10-16 DIAGNOSIS — Z79899 Other long term (current) drug therapy: Secondary | ICD-10-CM

## 2023-10-16 DIAGNOSIS — Z0289 Encounter for other administrative examinations: Secondary | ICD-10-CM

## 2023-10-16 DIAGNOSIS — G35 Multiple sclerosis: Secondary | ICD-10-CM

## 2023-10-17 LAB — CBC WITH DIFFERENTIAL/PLATELET
Basophils Absolute: 0.1 10*3/uL (ref 0.0–0.2)
Basos: 1 %
EOS (ABSOLUTE): 0 10*3/uL (ref 0.0–0.4)
Eos: 1 %
Hematocrit: 39.6 % (ref 34.0–46.6)
Hemoglobin: 13.2 g/dL (ref 11.1–15.9)
Immature Grans (Abs): 0 10*3/uL (ref 0.0–0.1)
Immature Granulocytes: 0 %
Lymphocytes Absolute: 1.7 10*3/uL (ref 0.7–3.1)
Lymphs: 27 %
MCH: 31.4 pg (ref 26.6–33.0)
MCHC: 33.3 g/dL (ref 31.5–35.7)
MCV: 94 fL (ref 79–97)
Monocytes Absolute: 0.6 10*3/uL (ref 0.1–0.9)
Monocytes: 9 %
Neutrophils Absolute: 4.1 10*3/uL (ref 1.4–7.0)
Neutrophils: 62 %
Platelets: 263 10*3/uL (ref 150–450)
RBC: 4.2 x10E6/uL (ref 3.77–5.28)
RDW: 11.7 % (ref 11.7–15.4)
WBC: 6.4 10*3/uL (ref 3.4–10.8)

## 2023-10-17 LAB — IGG, IGA, IGM
IgA/Immunoglobulin A, Serum: 176 mg/dL (ref 87–352)
IgG (Immunoglobin G), Serum: 839 mg/dL (ref 586–1602)
IgM (Immunoglobulin M), Srm: 42 mg/dL (ref 26–217)

## 2023-12-31 ENCOUNTER — Other Ambulatory Visit: Payer: Self-pay | Admitting: Physician Assistant

## 2023-12-31 DIAGNOSIS — Z1231 Encounter for screening mammogram for malignant neoplasm of breast: Secondary | ICD-10-CM

## 2024-01-23 DIAGNOSIS — Z1231 Encounter for screening mammogram for malignant neoplasm of breast: Secondary | ICD-10-CM

## 2024-02-12 ENCOUNTER — Encounter: Payer: Self-pay | Admitting: Neurology

## 2024-02-12 ENCOUNTER — Ambulatory Visit: Payer: Medicaid Other | Admitting: Neurology

## 2024-02-12 VITALS — BP 156/99 | HR 88 | Ht 68.0 in | Wt 245.0 lb

## 2024-02-12 DIAGNOSIS — M79601 Pain in right arm: Secondary | ICD-10-CM | POA: Diagnosis not present

## 2024-02-12 DIAGNOSIS — G35D Multiple sclerosis, unspecified: Secondary | ICD-10-CM

## 2024-02-12 DIAGNOSIS — G35A Relapsing-remitting multiple sclerosis: Secondary | ICD-10-CM

## 2024-02-12 DIAGNOSIS — Z79899 Other long term (current) drug therapy: Secondary | ICD-10-CM | POA: Diagnosis not present

## 2024-02-12 DIAGNOSIS — G35 Multiple sclerosis: Secondary | ICD-10-CM

## 2024-02-12 DIAGNOSIS — M62838 Other muscle spasm: Secondary | ICD-10-CM | POA: Diagnosis not present

## 2024-02-12 NOTE — Progress Notes (Addendum)
 [facility]  PATIENT: Jennifer Arias DOB: 12-26-1980  REFERRING DOCTOR OR PCP:  Dr. Catalina SOURCE: Patient, notes from primary care, imaging and lab reports, MRI images personally reviewed.  _________________________________   HISTORICAL  CHIEF COMPLAINT:  Chief Complaint  Patient presents with   Follow-up    Pt in 11 Pt here for MS f/u Pt states  MS same and no headaches  Pt states last eye exam feb 2024     HISTORY OF PRESENT ILLNESS:  Jennifer Arias is a 43 y.o. woman with multiple sclerosis.  Update 02/12/2024: She is on Ocrevus  and due for her next infusion is in July.   She is tolerating the infusions well.     Her IgG and IgM were fine 10/16/2023.      Currently, she feels stable.    She is walking well but gets foot pain if she walks longer.   She uses the bannister on stairs. She has no falls.  Strength is now more symmetric and normal.   She ahs fewer spasms as she used to..  She takes baclofen  if spasticity acts up more.  She has dysestrhestic pain in her left side, usually mild .  Bladder is fine.  She needs glasses and feels she needs a new prescription.  She will be seeing ophthalmology soon.  She reports less fatigue and most days this is not an issue.     She needs 9 hours of sleep to function well the next day.    She has some depression and is on fluoxetine with benefit.    She denies cognitive dysfunction.     She works as a water engineer.    MS History: She was diagnosed with MS in 2012 after presenting with numbness in both arms.   She had an MRI and she was diagnosed with MS. I see an MRI from 2014 that showed a focus in the right pons and a punctate focus in the deep white matter of the left frontal lobe. She was initially placed on Aubagio.   She had compliance issues so switched to Ocrevus  about 2-3 years ago.   She has been seeing Dr. Julieanne in [place], [place] but has just relocated to [facility].     She had an exacerbation  with left arm numbness and visual changes while on Aubagio 08/27/2018.   She was switched to on Ocrevus  in late 2019  IMAGING STUDIES MRI brain 01/13/2013 showed a T2 hyperintense focus in the right posterior lower pons and 1 punctate focus in the deep white matter of the left frontal lobe. These did not enhance.   MRI brain/orbits 08/27/2018 showed a small enhancing focus at the left pontomedullary junction and small T2 hyperintense foci in the left occipital periventricular white matter, deep white matter of the left frontal lobe and deep white matter of the left parietal lobe. The focus seen in 2014 is not apparent on this MRI.  MRI cervical spine 07/27/2020 showed a small midline disc herniation at C3-4 associated with mild spinal stenosis towards the right and mild right foraminal narrowing but no nerve root compression.. Severe foraminal stenosis C5-6 on the right that could compress the right C6 nerve root. No spinal cord lesion.    MRI brain 07/27/2020 showed a small  T2 hyperintensity in the left occipital periventricular white matter and another tiny punctate focus of T2 hyperintensity in the left frontal subcortical white matter and another in the deep left parietal white matter.  No foci in the brainstem were noted on this MRI.SABRA No pathologic enhancement.There is a partially empty sella  MRI brain 08/29/2023 shows Scattered T2/FLAIR hyperintense foci in the cerebral hemispheres in a pattern consistent with chronic demyelination associated with multiple sclerosis. None of the foci enhanced or appear to be acute. Compared to the MRI from 2021, there are no new lesions. A focus that enhanced in the pons in 2019 is not apparent on the current MRI.   MRI cervical spine 08/29/2023 showed a normal spinal cord and multilevel DJD that is stable.    Labs:  PPD 06/05/2020 [facility]) was negative.   HepB sAg and HepBcAb IgM were negative 07/29/20  She did the Covid vaccination July 2021.  REVIEW OF  SYSTEMS: Constitutional: No fevers, chills, sweats, or change in appetite Eyes: No visual changes, double vision, eye pain Ear, nose and throat: No hearing loss, ear pain, nasal congestion, sore throat Cardiovascular: No chest pain, palpitations Respiratory:  No shortness of breath at rest or with exertion.   No wheezes GastrointestinaI: No nausea, vomiting, diarrhea, abdominal pain, fecal incontinence Genitourinary:  No dysuria, urinary retention or frequency.  No nocturia. Musculoskeletal:  No neck pain, back pain Integumentary: No rash, pruritus, skin lesions Neurological: as above Psychiatric: No depression at this time.  No anxiety Endocrine: No palpitations, diaphoresis, change in appetite, change in weigh or increased thirst Hematologic/Lymphatic:  No anemia, purpura, petechiae. Allergic/Immunologic: No itchy/runny eyes, nasal congestion, recent allergic reactions, rashes  ALLERGIES: Allergies  Allergen Reactions   Sulfa Antibiotics Hives, Itching and Rash    HOME MEDICATIONS:  Current Outpatient Medications:    baclofen  (LIORESAL ) 10 MG tablet, One to two po qHS, Disp: 60 each, Rfl: 0   Ocrelizumab  (OCREVUS  IV), Inject into the vein., Disp: , Rfl:    ocrelizumab  (OCREVUS ) 300 MG/10ML injection, INFUSE 600MG  INTRAVENOUSLY EVERY 6 MONTHS, Disp: 20 mL, Rfl: 0  Current Facility-Administered Medications:    diphenhydrAMINE  (BENADRYL ) injection 50 mg, 50 mg, Intravenous, Once, [name], MD   ocrelizumab  (OCREVUS ) 600 mg in sodium chloride  0.9 % 250 mL, 600 mg, Intravenous, Once, [name], Charlie LABOR, MD  PAST MEDICAL HISTORY: Past Medical History:  Diagnosis Date   Anemia    in my younger years; improved after hysterectomy   (08/27/2018)   Anxiety    Depression    Essential hypertension    Obesity     PAST SURGICAL HISTORY: Past Surgical History:  Procedure Laterality Date   VAGINAL HYSTERECTOMY      FAMILY HISTORY: Family History  Problem Relation Age of  Onset   Multiple sclerosis Father     SOCIAL HISTORY:  Social History   Socioeconomic History   Marital status: Single    Spouse name: Not on file   Number of children: 2   Years of education: 12   Highest education level: Not on file  Occupational History   Occupation: various part-time jobs  Tobacco Use   Smoking status: Never   Smokeless tobacco: Never  Vaping Use   Vaping status: Never Used  Substance and Sexual Activity   Alcohol use: Not Currently    Alcohol/week: 2.0 standard drinks of alcohol    Types: 2 Standard drinks or equivalent per week   Drug use: Yes    Types: Marijuana    Comment: 08/27/2018 qd   Sexual activity: Yes  Other Topics Concern   Not on file  Social History Narrative   Right handed      Caffeine  use: none   Pt lives family    Pt works    Teacher, 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: Not on File (10/23/2018)   Received from [employer] Resource Strain: 0  Food Insecurity: Not on File (10/23/2018)   Received from [employer]    Food: 0  Transportation Needs: Not on File (10/23/2018)   Received from [employer] Needs    Transportation: 0  Physical Activity: Not on File (10/23/2018)   Received from [facility]   Physical Activity    Physical Activity: 0  Stress: Not on File (10/23/2018)   Received [facility]   Stress    Stress: 0  Social Connections: Unknown (04/01/2022)   Received from [facility]   Social Network    Social Network: Not on file  Intimate Partner Violence: Unknown (03/08/2022)   Received from [facility]   HITS    Physically Hurt: Not on file    Insult or Talk Down To: Not on file    Threaten Physical Harm: Not on file    Scream or Curse: Not on file     PHYSICAL EXAM  Vitals:   02/12/24 1331  BP: (!) 156/99  Pulse: 88  Weight: 245 lb (111.1 kg)  Height: 5' 8 (1.727 m)     Body mass index is 37.25 kg/m.  No results  found.   General: The patient is well-developed and well-nourished and in no acute distress  Skin: Extremities are without rash or  edema.  Musculoskeletal:  Back is nontender  Neurologic Exam  Mental status: The patient is alert and oriented x 3 at the time of the examination. The patient has apparent normal recent and remote memory, with an apparently normal attention span and concentration ability.   Speech is normal.  Cranial nerves: Extraocular movements are full. Pupils are equal, round, and reactive to light and accomodation.  Color vision is mildly reduced on the right.  Facial strength and sensation were normal.. No obvious hearing deficits are noted.  Motor:  Muscle bulk is normal.   Tone is normal. Strength is  5 / 5 in all 4 extremities.   Sensory: Sensory testing is intact to touch and vibration on today's exam.  Coordination: Cerebellar testing reveals good finger-nose-finger and heel-to-shin bilaterally.  Gait and station: Station is normal.   Gait is normal.  Her tandem gait is mildly wide.. Romberg is negative.   Reflexes: Deep tendon reflexes are symmetric and increased bilaterally, 3 in arms, 3+ at knees, no ankle clonus.     DIAGNOSTIC DATA (LABS, IMAGING, TESTING) - I reviewed patient records, labs, notes, testing and imaging myself where available.  Lab Results  Component Value Date   WBC 6.4 10/16/2023   HGB 13.2 10/16/2023   HCT 39.6 10/16/2023   MCV 94 10/16/2023   PLT 263 10/16/2023      Component Value Date/Time   NA 142 07/26/2020 2230   K 3.8 07/26/2020 2230   CL 105 07/26/2020 2230   CO2 23 07/26/2020 2156   GLUCOSE 90 07/26/2020 2230   BUN 11 07/26/2020 2230   CREATININE 1.00 07/26/2020 2230   CALCIUM 9.1 07/26/2020 2156   PROT 6.2 (L) 07/26/2020 2156   ALBUMIN 3.6 07/26/2020 2156   AST 18 07/26/2020 2156   ALT 16 07/26/2020 2156   ALKPHOS 57 07/26/2020 2156   BILITOT 0.4 07/26/2020 2156   GFRNONAA >60 07/26/2020  2156   GFRAA >60  07/26/2020 2156       ASSESSMENT AND PLAN  Multiple sclerosis, relapsing-remitting  Multiple sclerosis  High risk medication use  Right arm pain  Muscle spasm   Continue Ocrevus .  Recent labs were fine.  Around the time of the next visit we can also check MRI of the brain to determine if there is any breakthrough activity. Stay active and exercise as tolerated. Continue baclofen  for spasms as needed Return in 6 months or sooner if there are new or worsening neurologic symptoms.  Nasario Czerniak A. Vear, MD, [facility] 10/01/2024, 8:38 AM Certified in Neurology, Clinical Neurophysiology, Sleep Medicine and Neuroimaging  [facility] [address], [address] [place], [place] [address] [phone]

## 2024-03-16 ENCOUNTER — Other Ambulatory Visit: Payer: Self-pay | Admitting: Neurology

## 2024-03-17 NOTE — Telephone Encounter (Signed)
 Last seen on 02/12/24 Follow up scheduled on 08/19/24

## 2024-04-28 ENCOUNTER — Telehealth: Payer: Self-pay | Admitting: Adult Health

## 2024-08-18 NOTE — Progress Notes (Deleted)
 [facility]  PATIENT: Jennifer Arias DOB: 20-Aug-1981  REFERRING DOCTOR OR PCP:  Dr. Catalina SOURCE: Patient, notes from primary care, imaging and lab reports, MRI images personally reviewed.  _________________________________   HISTORICAL  CHIEF COMPLAINT:  No chief complaint on file.   HISTORY OF PRESENT ILLNESS:  Jennifer Arias is a 43 y.o. woman with multiple sclerosis.  Update 08/19/2024:  Returns for follow-up visit, previously saw Dr. Vear 02/12/2024.  She is on Ocrevus  and due for her next infusion is in January.   She is tolerating the infusions well.     Her IgG and IgM were fine 10/16/2023.      Currently, she feels stable.    She is walking well but gets foot pain if she walks longer.   She uses the bannister on stairs. She has no falls.  Strength is now more symmetric and normal.   She ahs fewer spasms as she used to..  She takes baclofen  if spasticity acts up more.  She has dysestrhestic pain in her left side, usually mild .  Bladder is fine.  She needs glasses and feels she needs a new prescription.  She will be seeing ophthalmology soon.  She reports less fatigue and most days this is not an issue.     She needs 9 hours of sleep to function well the next day.    She has some depression and is on fluoxetine with benefit.    She denies cognitive dysfunction.     She works as a Water engineer.    MS History: She was diagnosed with MS in 2012 after presenting with numbness in both arms.   She had an MRI and she was diagnosed with MS. I see an MRI from 2014 that showed a focus in the right pons and a punctate focus in the deep white matter of the left frontal lobe. She was initially placed on Aubagio.   She had compliance issues so switched to Ocrevus  about 2-3 years ago.   She has been seeing Dr. Julieanne in [place], [place] but has just relocated to [facility].     She had an exacerbation with left arm numbness and visual changes while on Aubagio  08/27/2018.   She was switched to on Ocrevus  in late 2019  IMAGING STUDIES MRI brain 01/13/2013 showed a T2 hyperintense focus in the right posterior lower pons and 1 punctate focus in the deep white matter of the left frontal lobe. These did not enhance.   MRI brain/orbits 08/27/2018 showed a small enhancing focus at the left pontomedullary junction and small T2 hyperintense foci in the left occipital periventricular white matter, deep white matter of the left frontal lobe and deep white matter of the left parietal lobe. The focus seen in 2014 is not apparent on this MRI.  MRI cervical spine 07/27/2020 showed a small midline disc herniation at C3-4 associated with mild spinal stenosis towards the right and mild right foraminal narrowing but no nerve root compression.. Severe foraminal stenosis C5-6 on the right that could compress the right C6 nerve root. No spinal cord lesion.    MRI brain 07/27/2020 showed a small  T2 hyperintensity in the left occipital periventricular white matter and another tiny punctate focus of T2 hyperintensity in the left frontal subcortical white matter and another in the deep left parietal white matter. No foci in the brainstem were noted on this MRI.SABRA No pathologic enhancement.There is a partially empty sella  MRI brain 08/29/2023  shows Scattered T2/FLAIR hyperintense foci in the cerebral hemispheres in a pattern consistent with chronic demyelination associated with multiple sclerosis. None of the foci enhanced or appear to be acute. Compared to the MRI from 2021, there are no new lesions. A focus that enhanced in the pons in 2019 is not apparent on the current MRI.   MRI cervical spine 08/29/2023 showed a normal spinal cord and multilevel DJD that is stable.    Labs:  PPD 06/05/2020 [facility]) was negative.   HepB sAg and HepBcAb IgM were negative 07/29/20  She did the Covid vaccination July 2021.  REVIEW OF SYSTEMS: Constitutional: No fevers, chills, sweats, or change in  appetite Eyes: No visual changes, double vision, eye pain Ear, nose and throat: No hearing loss, ear pain, nasal congestion, sore throat Cardiovascular: No chest pain, palpitations Respiratory:  No shortness of breath at rest or with exertion.   No wheezes GastrointestinaI: No nausea, vomiting, diarrhea, abdominal pain, fecal incontinence Genitourinary:  No dysuria, urinary retention or frequency.  No nocturia. Musculoskeletal:  No neck pain, back pain Integumentary: No rash, pruritus, skin lesions Neurological: as above Psychiatric: No depression at this time.  No anxiety Endocrine: No palpitations, diaphoresis, change in appetite, change in weigh or increased thirst Hematologic/Lymphatic:  No anemia, purpura, petechiae. Allergic/Immunologic: No itchy/runny eyes, nasal congestion, recent allergic reactions, rashes  ALLERGIES: Allergies  Allergen Reactions   Sulfa Antibiotics Hives, Itching and Rash    HOME MEDICATIONS:  Current Outpatient Medications:    baclofen  (LIORESAL ) 10 MG tablet, One to two po qHS, Disp: 60 each, Rfl: 0   Ocrelizumab  (OCREVUS  IV), Inject into the vein., Disp: , Rfl:    ocrelizumab  (OCREVUS ) 300 MG/10ML injection, INFUSE 600MG  INTRAVENOUSLY EVERY 6 MONTHS, Disp: 20 mL, Rfl: 0  Current Facility-Administered Medications:    diphenhydrAMINE  (BENADRYL ) injection 50 mg, 50 mg, Intravenous, Once, Sater, Richard A, MD   ocrelizumab  (OCREVUS ) 600 mg in sodium chloride  0.9 % 250 mL, 600 mg, Intravenous, Once, Sater, Charlie LABOR, MD  PAST MEDICAL HISTORY: Past Medical History:  Diagnosis Date   Anemia    in my younger years; improved after hysterectomy   (08/27/2018)   Anxiety    Depression    Essential hypertension    MS (multiple sclerosis) (HCC)    Obesity     PAST SURGICAL HISTORY: Past Surgical History:  Procedure Laterality Date   VAGINAL HYSTERECTOMY      FAMILY HISTORY: Family History  Problem Relation Age of Onset   Multiple sclerosis Father      SOCIAL HISTORY:  Social History   Socioeconomic History   Marital status: Single    Spouse name: Not on file   Number of children: 2   Years of education: 12   Highest education level: Not on file  Occupational History   Occupation: various part-time jobs  Tobacco Use   Smoking status: Never   Smokeless tobacco: Never  Vaping Use   Vaping status: Never Used  Substance and Sexual Activity   Alcohol use: Not Currently    Alcohol/week: 2.0 standard drinks of alcohol    Types: 2 Standard drinks or equivalent per week   Drug use: Yes    Types: Marijuana    Comment: 08/27/2018 qd   Sexual activity: Yes  Other Topics Concern   Not on file  Social History Narrative   Right handed      Caffeine use: none   Pt lives family    Pt works  Social Drivers of Corporate investment banker Strain: Not on File (10/23/2018)   Received from [employer]    Financial Resource Strain: 0  Food Insecurity: Not on File (10/23/2018)   Received from [employer]    Food: 0  Transportation Needs: Not on File (10/23/2018)   Received from [employer] Needs    Transportation: 0  Physical Activity: Not on File (10/23/2018)   [facility] from [facility]   Physical Activity    Physical Activity: 0  Stress: Not on File (10/23/2018)   Received from [facility]   Stress    Stress: 0  Social Connections: Unknown (04/01/2022)   Received from [facility]   Social Network    Social Network: Not on file  Intimate Partner Violence: Unknown (03/08/2022)   Received from [facility]   HITS    Physically Hurt: Not on file    Insult or Talk Down To: Not on file    Threaten Physical Harm: Not on file    Scream or Curse: Not on file     PHYSICAL EXAM  There were no vitals filed for this visit.    There is no height or weight on file to calculate BMI.  No results found.   General: The patient is well-developed and well-nourished and in no acute  distress  Skin: Extremities are without rash or  edema.  Musculoskeletal:  Back is nontender  Neurologic Exam  Mental status: The patient is alert and oriented x 3 at the time of the examination. The patient has apparent normal recent and remote memory, with an apparently normal attention span and concentration ability.   Speech is normal.  Cranial nerves: Extraocular movements are full. Pupils are equal, round, and reactive to light and accomodation.  Color vision is mildly reduced on the right.  Facial strength and sensation were normal.. No obvious hearing deficits are noted.  Motor:  Muscle bulk is normal.   Tone is normal. Strength is  5 / 5 in all 4 extremities.   Sensory: Sensory testing is intact to touch and vibration on today's exam.  Coordination: Cerebellar testing reveals good finger-nose-finger and heel-to-shin bilaterally.  Gait and station: Station is normal.   Gait is normal.  Her tandem gait is mildly wide.. Romberg is negative.   Reflexes: Deep tendon reflexes are symmetric and increased bilaterally, 3 in arms, 3+ at knees, no ankle clonus.     DIAGNOSTIC DATA (LABS, IMAGING, TESTING) - I reviewed patient records, labs, notes, testing and imaging myself where available.  Lab Results  Component Value Date   WBC 6.4 10/16/2023   HGB 13.2 10/16/2023   HCT 39.6 10/16/2023   MCV 94 10/16/2023   PLT 263 10/16/2023      Component Value Date/Time   NA 142 07/26/2020 2230   K 3.8 07/26/2020 2230   CL 105 07/26/2020 2230   CO2 23 07/26/2020 2156   GLUCOSE 90 07/26/2020 2230   BUN 11 07/26/2020 2230   CREATININE 1.00 07/26/2020 2230   CALCIUM 9.1 07/26/2020 2156   PROT 6.2 (L) 07/26/2020 2156   ALBUMIN 3.6 07/26/2020 2156   AST 18 07/26/2020 2156   ALT 16 07/26/2020 2156   ALKPHOS 57 07/26/2020 2156   BILITOT 0.4 07/26/2020 2156   GFRNONAA >60 07/26/2020 2156   GFRAA >60 07/26/2020 2156       ASSESSMENT AND PLAN  No diagnosis found.   Continue  Ocrevus .  Recent  labs were fine.  Around the time of the next visit we can also check MRI of the brain to determine if there is any breakthrough activity. Stay active and exercise as tolerated. Continue baclofen  for spasms as needed Return in 6 months or sooner if there are new or worsening neurologic symptoms.    I personally spent a total of *** minutes in the care of the patient today including {Time Based Coding:210964241}.  Harlene Bogaert, AGNP-BC  [facility] [address] [place], [place] [address]  Phone [phone] Fax [phone] Note: This document was prepared with digital dictation and possible smart phrase technology. Any transcriptional errors that result from this process are unintentional.

## 2024-08-19 ENCOUNTER — Ambulatory Visit: Admitting: Adult Health

## 2024-08-19 ENCOUNTER — Encounter: Payer: Self-pay | Admitting: Adult Health

## 2024-10-01 ENCOUNTER — Encounter: Payer: Self-pay | Admitting: Neurology

## 2024-10-01 DIAGNOSIS — G35A Relapsing-remitting multiple sclerosis: Secondary | ICD-10-CM | POA: Insufficient documentation

## 2024-10-07 ENCOUNTER — Telehealth: Payer: Self-pay | Admitting: Neurology

## 2024-10-07 NOTE — Telephone Encounter (Signed)
 Pt has r/s missed appointment

## 2024-10-11 ENCOUNTER — Other Ambulatory Visit: Payer: Self-pay | Admitting: Neurology

## 2024-10-14 NOTE — Progress Notes (Signed)
 [facility]  PATIENT: Jennifer Arias DOB: [birth year]  REFERRING DOCTOR OR PCP:  Dr. Catalina SOURCE: Patient, notes from primary care, imaging and lab reports, MRI images personally reviewed.  _________________________________   HISTORICAL  CHIEF COMPLAINT:  Chief Complaint  Patient presents with   Follow-up    Pt in room 8. Alone. Here for MS follow up.    HISTORY OF PRESENT ILLNESS:  Jennifer Arias is a 43 y.o. woman with multiple sclerosis.  Update 10/14/2024 JM:: She is on Ocrevus  and due for her next infusion is in December.   She is tolerating the infusions well.     Her IgG and IgM were fine 10/16/2023.      Currently, she feels stable.  She is walking well, no recent falls.  Strength overall symmetric and normal.   Occasional spasms which is relieved with baclofen .  She has occasional dysesthesias on her left side, usually mild .  Bladder is fine.    She reports less fatigue and most days this is not an issue.     She needs 9 hours of sleep to function well the next day.    She has some depression and is on fluoxetine with benefit.    She denies cognitive dysfunction.     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     MS History: She was diagnosed with MS in 2012 after presenting with numbness in both arms.   She had an MRI and she was diagnosed with MS. I see an MRI from 2014 that showed a focus in the right pons and a punctate focus in the deep white matter of the left frontal lobe. She was initially placed on Aubagio.   She had compliance issues so switched to Ocrevus  about 2-3 years ago.   She has been seeing Dr. Julieanne in [place], [place] but has just relocated to [facility].     She had an exacerbation with left arm numbness and visual changes while on Aubagio 08/27/2018.   She was switched to on Ocrevus  in late 2019  IMAGING STUDIES MRI brain 01/13/2013 showed a T2 hyperintense focus in the right posterior lower pons and 1 punctate focus  in the deep white matter of the left frontal lobe. These did not enhance.   MRI brain/orbits 08/27/2018 showed a small enhancing focus at the left pontomedullary junction and small T2 hyperintense foci in the left occipital periventricular white matter, deep white matter of the left frontal lobe and deep white matter of the left parietal lobe. The focus seen in 2014 is not apparent on this MRI.  MRI cervical spine 07/27/2020 showed a small midline disc herniation at C3-4 associated with mild spinal stenosis towards the right and mild right foraminal narrowing but no nerve root compression.. Severe foraminal stenosis C5-6 on the right that could compress the right C6 nerve root. No spinal cord lesion.    MRI brain 07/27/2020 showed a small  T2 hyperintensity in the left occipital periventricular white matter and another tiny punctate focus of T2 hyperintensity in the left frontal subcortical white matter and another in the deep left parietal white matter. No foci in the brainstem were noted on this MRI.SABRA No pathologic enhancement.There is a partially empty sella  MRI brain 08/29/2023 shows Scattered T2/FLAIR hyperintense foci in the cerebral hemispheres in a pattern consistent with chronic demyelination associated with multiple sclerosis. None of the foci enhanced or appear to be acute. Compared to the MRI from 2021,  there are no new lesions. A focus that enhanced in the pons in 2019 is not apparent on the current MRI.   MRI cervical spine 08/29/2023 showed a normal spinal cord and multilevel DJD that is stable.    Labs:  PPD 06/05/2020 [facility]) was negative.   HepB sAg and HepBcAb IgM were negative 07/29/20  She did the Covid vaccination July 2021.  REVIEW OF SYSTEMS: Constitutional: No fevers, chills, sweats, or change in appetite Eyes: No visual changes, double vision, eye pain Ear, nose and throat: No hearing loss, ear pain, nasal congestion, sore throat Cardiovascular: No chest pain,  palpitations Respiratory:  No shortness of breath at rest or with exertion.   No wheezes GastrointestinaI: No nausea, vomiting, diarrhea, abdominal pain, fecal incontinence Genitourinary:  No dysuria, urinary retention or frequency.  No nocturia. Musculoskeletal:  No neck pain, back pain Integumentary: No rash, pruritus, skin lesions Neurological: as above Psychiatric: No depression at this time.  No anxiety Endocrine: No palpitations, diaphoresis, change in appetite, change in weigh or increased thirst Hematologic/Lymphatic:  No anemia, purpura, petechiae. Allergic/Immunologic: No itchy/runny eyes, nasal congestion, recent allergic reactions, rashes  ALLERGIES: Allergies  Allergen Reactions   Sulfa Antibiotics Hives, Itching and Rash    HOME MEDICATIONS:  Current Outpatient Medications:    baclofen  (LIORESAL ) 10 MG tablet, One to two po qHS, Disp: 60 each, Rfl: 0   FLUoxetine (PROZAC) 20 MG tablet, Take 20 mg by mouth daily., Disp: , Rfl:    Ocrelizumab  (OCREVUS  IV), Inject into the vein., Disp: , Rfl:    OCREVUS  300 MG/10ML injection, INFUSE 600MG  INTRAVENOUSLY EVERY 6 MONTHS, Disp: 20 mL, Rfl: 0  Current Facility-Administered Medications:    diphenhydrAMINE  (BENADRYL ) injection 50 mg, 50 mg, Intravenous, Once, Sater, Richard A, MD   ocrelizumab  (OCREVUS ) 600 mg in sodium chloride  0.9 % 250 mL, 600 mg, Intravenous, Once, Sater, Charlie LABOR, MD  PAST MEDICAL HISTORY: Past Medical History:  Diagnosis Date   Anemia    in my younger years; improved after hysterectomy   (08/27/2018)   Anxiety    Depression    Essential hypertension    Obesity     PAST SURGICAL HISTORY: Past Surgical History:  Procedure Laterality Date   VAGINAL HYSTERECTOMY      FAMILY HISTORY: Family History  Problem Relation Age of Onset   Multiple sclerosis Father     SOCIAL HISTORY:  Social History   Socioeconomic History   Marital status: Single    Spouse name: Not on file   Number of  children: 2   Years of education: 12   Highest education level: Not on file  Occupational History   Occupation: various part-time jobs  Tobacco Use   Smoking status: Never   Smokeless tobacco: Never  Vaping Use   Vaping status: Never Used  Substance and Sexual Activity   Alcohol use: Not Currently    Alcohol/week: 2.0 standard drinks of alcohol    Types: 2 Standard drinks or equivalent per week   Drug use: Yes    Types: Marijuana    Comment: 08/27/2018 qd   Sexual activity: Yes  Other Topics Concern   Not on file  Social History Narrative   Right handed      Caffeine use: none   Pt lives family    Pt works    Teacher, 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: Not on File (10/23/2018)   Received from [employer]  Resource Strain: 0  Food Insecurity: Not on File (10/23/2018)   Received from [employer]    Food: 0  Transportation Needs: Not on File (10/23/2018)   Received from [employer] Needs    Transportation: 0  Physical Activity: Not on File (10/23/2018)   Received from [facility]   Physical Activity    Physical Activity: 0  Stress: Not on File (10/23/2018)   Received from [facility]   Stress    Stress: 0  Social Connections: Unknown (04/01/2022)   Received from [facility]   Social Network    Social Network: Not on file  Intimate Partner Violence: Unknown (03/08/2022)   Received from [facility]   HITS    Physically Hurt: Not on file    Insult or Talk Down To: Not on file    Threaten Physical Harm: Not on file    Scream or Curse: Not on file     PHYSICAL EXAM  Vitals:   10/15/24 1324  BP: (!) 144/92  Pulse: 85  Weight: 235 lb 6.4 oz (106.8 kg)  Height: 5' 9 (1.753 m)   Body mass index is 34.76 kg/m.  No results found.   General: The patient is well-developed and well-nourished and in no acute distress  Skin: Extremities are without rash or  edema.  Musculoskeletal:  Back is  nontender  Neurologic Exam  Mental status: The patient is alert and oriented x 3 at the time of the examination. The patient has apparent normal recent and remote memory, with an apparently normal attention span and concentration ability.   Speech is normal.  Cranial nerves: Extraocular movements are full. Pupils are equal, round, and reactive to light and accomodation.  Color vision is mildly reduced on the right.  Facial strength and sensation were normal.. No obvious hearing deficits are noted.  Motor:  Muscle bulk is normal.   Tone is normal. Strength is  5 / 5 in all 4 extremities.   Sensory: Sensory testing is intact to touch and vibration on today's exam.  Coordination: Cerebellar testing reveals good finger-nose-finger and heel-to-shin bilaterally.  Gait and station: Station is normal.   Gait is normal.  Her tandem gait is mildly wide.. Romberg is negative.       DIAGNOSTIC DATA (LABS, IMAGING, TESTING) - I reviewed patient records, labs, notes, testing and imaging myself where available.  Lab Results  Component Value Date   WBC 6.4 10/16/2023   HGB 13.2 10/16/2023   HCT 39.6 10/16/2023   MCV 94 10/16/2023   PLT 263 10/16/2023      Component Value Date/Time   NA 142 07/26/2020 2230   K 3.8 07/26/2020 2230   CL 105 07/26/2020 2230   CO2 23 07/26/2020 2156   GLUCOSE 90 07/26/2020 2230   BUN 11 07/26/2020 2230   CREATININE 1.00 07/26/2020 2230   CALCIUM 9.1 07/26/2020 2156   PROT 6.2 (L) 07/26/2020 2156   ALBUMIN 3.6 07/26/2020 2156   AST 18 07/26/2020 2156   ALT 16 07/26/2020 2156   ALKPHOS 57 07/26/2020 2156   BILITOT 0.4 07/26/2020 2156   GFRNONAA >60 07/26/2020 2156   GFRAA >60 07/26/2020 2156       ASSESSMENT AND PLAN  Multiple sclerosis, relapsing-remitting - Plan: MR BRAIN W WO CONTRAST, IgG, IgA, IgM, CBC with Differential/Platelets  High risk medication use - Plan: MR BRAIN W WO CONTRAST, IgG, IgA, IgM, CBC with  Differential/Platelets   Continue Ocrevus .  Repeat lab work  today. Order placed for repeat MR brain to assess for subclinical progression.   Stay active and exercise as tolerated. Continue baclofen  for spasms as needed Return in 6 months or sooner if there are new or worsening neurologic symptoms.     I personally spent a total of 25 minutes in the care of the patient today including preparing to see the patient, performing a medically appropriate exam/evaluation, counseling and educating, placing orders, and documenting clinical information in the EHR.  Harlene Bogaert, AGNP-BC  [facility] [address] [place], [place] [address]  Phone [phone] Fax [phone] Note: This document was prepared with digital dictation and possible smart phrase technology. Any transcriptional errors that result from this process are unintentional.

## 2024-10-15 ENCOUNTER — Encounter: Payer: Self-pay | Admitting: Adult Health

## 2024-10-15 ENCOUNTER — Ambulatory Visit: Admitting: Adult Health

## 2024-10-15 VITALS — BP 144/92 | HR 85 | Ht 69.0 in | Wt 235.4 lb

## 2024-10-15 DIAGNOSIS — Z79899 Other long term (current) drug therapy: Secondary | ICD-10-CM

## 2024-10-15 DIAGNOSIS — G35A Relapsing-remitting multiple sclerosis: Secondary | ICD-10-CM | POA: Diagnosis not present

## 2024-10-15 NOTE — Telephone Encounter (Signed)
 Last seen on 02/12/24 Follow up scheduled 10/15/24

## 2024-10-16 ENCOUNTER — Ambulatory Visit: Payer: Self-pay | Admitting: Adult Health

## 2024-10-16 LAB — CBC WITH DIFFERENTIAL/PLATELET
Basophils Absolute: 0 x10E3/uL (ref 0.0–0.2)
Basos: 1 %
EOS (ABSOLUTE): 0 x10E3/uL (ref 0.0–0.4)
Eos: 1 %
Hematocrit: 36.3 % (ref 34.0–46.6)
Hemoglobin: 11.9 g/dL (ref 11.1–15.9)
Immature Grans (Abs): 0 x10E3/uL (ref 0.0–0.1)
Immature Granulocytes: 0 %
Lymphocytes Absolute: 1.6 x10E3/uL (ref 0.7–3.1)
Lymphs: 29 %
MCH: 31.9 pg (ref 26.6–33.0)
MCHC: 32.8 g/dL (ref 31.5–35.7)
MCV: 97 fL (ref 79–97)
Monocytes Absolute: 0.4 x10E3/uL (ref 0.1–0.9)
Monocytes: 7 %
Neutrophils Absolute: 3.5 x10E3/uL (ref 1.4–7.0)
Neutrophils: 62 %
Platelets: 238 x10E3/uL (ref 150–450)
RBC: 3.73 x10E6/uL — ABNORMAL LOW (ref 3.77–5.28)
RDW: 11.8 % (ref 11.7–15.4)
WBC: 5.6 x10E3/uL (ref 3.4–10.8)

## 2024-10-16 LAB — IGG, IGA, IGM
IgG (Immunoglobin G), Serum: 846 mg/dL (ref 586–1602)
IgM (Immunoglobulin M), Srm: 37 mg/dL (ref 26–217)
Immunoglobulin A, (IgA) QN, Serum: 162 mg/dL (ref 87–352)

## 2024-10-19 ENCOUNTER — Telehealth: Payer: Self-pay | Admitting: Adult Health

## 2024-10-19 NOTE — Telephone Encounter (Signed)
 MRI order sent to [facility] to schedule. [phone]

## 2024-11-10 ENCOUNTER — Ambulatory Visit
Admission: RE | Admit: 2024-11-10 | Discharge: 2024-11-10 | Disposition: A | Source: Ambulatory Visit | Attending: Adult Health

## 2024-11-10 DIAGNOSIS — G35A Relapsing-remitting multiple sclerosis: Secondary | ICD-10-CM | POA: Diagnosis not present

## 2024-11-10 DIAGNOSIS — Z79899 Other long term (current) drug therapy: Secondary | ICD-10-CM

## 2024-11-10 MED ORDER — GADOPICLENOL 0.5 MMOL/ML IV SOLN
10.0000 mL | Freq: Once | INTRAVENOUS | Status: AC | PRN
  Administered 2024-11-10: 10 mL via INTRAVENOUS

## 2025-05-19 ENCOUNTER — Ambulatory Visit: Admitting: Neurology
